# Patient Record
Sex: Female | Born: 1990 | Race: White | Hispanic: No | Marital: Married | Smoking: Never smoker
Health system: Southern US, Community
[De-identification: ages and names within clinical notes are randomized; demographics above are authoritative.]

## PROBLEM LIST (undated history)

## (undated) DIAGNOSIS — O368130 Decreased fetal movements, third trimester, not applicable or unspecified: Secondary | ICD-10-CM

## (undated) DIAGNOSIS — M779 Enthesopathy, unspecified: Secondary | ICD-10-CM

## (undated) DIAGNOSIS — E663 Overweight: Secondary | ICD-10-CM

## (undated) DIAGNOSIS — I499 Cardiac arrhythmia, unspecified: Secondary | ICD-10-CM

## (undated) DIAGNOSIS — J45909 Unspecified asthma, uncomplicated: Secondary | ICD-10-CM

## (undated) DIAGNOSIS — N946 Dysmenorrhea, unspecified: Secondary | ICD-10-CM

## (undated) DIAGNOSIS — N920 Excessive and frequent menstruation with regular cycle: Secondary | ICD-10-CM

## (undated) DIAGNOSIS — N390 Urinary tract infection, site not specified: Secondary | ICD-10-CM

## (undated) DIAGNOSIS — M778 Other enthesopathies, not elsewhere classified: Secondary | ICD-10-CM

## (undated) DIAGNOSIS — E162 Hypoglycemia, unspecified: Secondary | ICD-10-CM

## (undated) HISTORY — DX: Overweight: E66.3

## (undated) HISTORY — DX: Other enthesopathies, not elsewhere classified: M77.8

## (undated) HISTORY — DX: Urinary tract infection, site not specified: N39.0

## (undated) HISTORY — DX: Cardiac arrhythmia, unspecified: I49.9

## (undated) HISTORY — DX: Dysmenorrhea, unspecified: N94.6

## (undated) HISTORY — DX: Hypoglycemia, unspecified: E16.2

## (undated) HISTORY — PX: WISDOM TOOTH EXTRACTION: SHX21

## (undated) HISTORY — DX: Enthesopathy, unspecified: M77.9

## (undated) HISTORY — DX: Excessive and frequent menstruation with regular cycle: N92.0

---

## 2015-09-02 ENCOUNTER — Ambulatory Visit (INDEPENDENT_AMBULATORY_CARE_PROVIDER_SITE_OTHER): Payer: BLUE CROSS/BLUE SHIELD | Admitting: Obstetrics and Gynecology

## 2015-09-02 VITALS — BP 103/73 | HR 71 | Ht 61.0 in | Wt 131.4 lb

## 2015-09-02 DIAGNOSIS — J029 Acute pharyngitis, unspecified: Secondary | ICD-10-CM

## 2015-09-02 LAB — POCT RAPID STREP A (OFFICE): Rapid Strep A Screen: NEGATIVE

## 2015-09-02 NOTE — Progress Notes (Signed)
Subjective:     Patient ID: Reem Fleury, female   DOB: 10-08-1991, 24 y.o.   MRN: [id number]  HPI Sore throat x 3 days Chest heaviness- not r/t irregular HR  Review of Systems Sore throat x 3 days with generalized malaise, low grade fever. Denies cough or sinus drainage  States her left chest feels heavy and sore at times, denies any recent arrythmia or r/t exertion, last for a few days then resolves- feels like it is muscles and on the surface- does not radiate. Concerned due to father recently died of massive heart attack at age 26.    Objective:   Physical Exam A&O x4 Well groomed female, looks tired Sinuses clear, back of throat slightly red, negative rapid strep test. Lungs clear bilaterally, negative lymphadenopathy. HRR.    Assessment:     Viral sore throat Chest heaviness suspected muscle etiology     Plan:     Tylenol prn, to let me know if symptoms don't resolve in 1 week.  RTC prn  Melody Trudee Kuster, CNM

## 2016-01-13 ENCOUNTER — Other Ambulatory Visit: Payer: Self-pay | Admitting: Obstetrics and Gynecology

## 2016-01-13 MED ORDER — CEFDINIR 300 MG PO CAPS: 300.0000 mg | ORAL_CAPSULE | Freq: Two times a day (BID) | ORAL | Status: DC

## 2016-03-02 ENCOUNTER — Other Ambulatory Visit: Payer: Self-pay | Admitting: Obstetrics and Gynecology

## 2016-03-02 MED ORDER — CEFDINIR 300 MG PO CAPS: 300.0000 mg | ORAL_CAPSULE | Freq: Two times a day (BID) | ORAL | Status: DC

## 2016-04-14 ENCOUNTER — Encounter: Payer: Self-pay | Admitting: *Deleted

## 2016-04-20 ENCOUNTER — Ambulatory Visit (INDEPENDENT_AMBULATORY_CARE_PROVIDER_SITE_OTHER): Payer: BLUE CROSS/BLUE SHIELD | Admitting: Obstetrics and Gynecology

## 2016-04-20 ENCOUNTER — Other Ambulatory Visit: Payer: Self-pay | Admitting: Obstetrics and Gynecology

## 2016-04-20 ENCOUNTER — Encounter: Payer: Self-pay | Admitting: Obstetrics and Gynecology

## 2016-04-20 VITALS — BP 98/64 | HR 88 | Ht 61.0 in | Wt 142.9 lb

## 2016-04-20 DIAGNOSIS — Z01419 Encounter for gynecological examination (general) (routine) without abnormal findings: Secondary | ICD-10-CM

## 2016-04-20 MED ORDER — NORETHIN ACE-ETH ESTRAD-FE 1-20 MG-MCG PO TABS
1.0000 | ORAL_TABLET | Freq: Every day | ORAL | Status: DC
Start: 2016-04-20 — End: 2016-04-20

## 2016-04-20 MED ORDER — NORETHIN ACE-ETH ESTRAD-FE 1-20 MG-MCG(24) PO CAPS
1.0000 | ORAL_CAPSULE | Freq: Every day | ORAL | Status: DC
Start: 2016-04-20 — End: 2016-09-14

## 2016-04-20 NOTE — Progress Notes (Signed)
  Subjective:     Selena White is a 25 y.o. female and is here for a comprehensive physical exam. The patient reports desires restarting weight loss meds.  Social History   Social History  . Marital Status: Married    Spouse Name: N/A  . Number of Children: N/A  . Years of Education: N/A   Occupational History  .  [employer]   Social History Main Topics  . Smoking status: Never Smoker   . Smokeless tobacco: Never Used  . Alcohol Use: No  . Drug Use: No  . Sexual Activity:    Partners: Male   Other Topics Concern  . Not on file   Social History Narrative   Health Maintenance  Topic Date Due  . HIV Screening  06/25/2006  . TETANUS/TDAP  06/25/2010  . PAP SMEAR  06/25/2012  . INFLUENZA VACCINE  07/12/2016    The following portions of the patient's history were reviewed and updated as appropriate: allergies, current medications, past family history, past medical history, past social history, past surgical history and problem list.  Review of Systems A comprehensive review of systems was negative.   Objective:    General appearance: alert, cooperative and appears stated age Neck: no adenopathy, no carotid bruit, no JVD, supple, symmetrical, trachea midline and thyroid not enlarged, symmetric, no tenderness/mass/nodules Lungs: clear to auscultation bilaterally Breasts: normal appearance, no masses or tenderness Heart: regular rate and rhythm, S1, S2 normal, no murmur, click, rub or gallop Abdomen: soft, non-tender; bowel sounds normal; no masses,  no organomegaly Pelvic: cervix normal in appearance, external genitalia normal, no adnexal masses or tenderness, no cervical motion tenderness, rectovaginal septum normal, uterus normal size, shape, and consistency and vagina normal without discharge Extremities: extremities normal, atraumatic, no cyanosis or edema    Assessment:    Healthy female exam. Overweight OCP user      Plan:  meds  refilled, and will restart weight loss program in future. RTC 1 year   See After Visit Summary for Counseling Recommendations

## 2016-04-20 NOTE — Patient Instructions (Signed)
Place annual gynecologic exam patient instructions here.

## 2016-04-21 LAB — CYTOLOGY - PAP

## 2016-05-18 ENCOUNTER — Ambulatory Visit: Payer: BLUE CROSS/BLUE SHIELD

## 2016-05-20 ENCOUNTER — Ambulatory Visit (INDEPENDENT_AMBULATORY_CARE_PROVIDER_SITE_OTHER): Payer: BLUE CROSS/BLUE SHIELD | Admitting: Obstetrics and Gynecology

## 2016-05-20 VITALS — BP 107/71 | HR 86 | Ht 61.0 in | Wt 140.2 lb

## 2016-05-20 DIAGNOSIS — R5383 Other fatigue: Secondary | ICD-10-CM

## 2016-05-20 MED ORDER — CYANOCOBALAMIN 1000 MCG/ML IJ SOLN
1000.0000 ug | Freq: Once | INTRAMUSCULAR | Status: AC
  Administered 2016-05-20: 1000 ug via INTRAMUSCULAR

## 2016-05-20 NOTE — Patient Instructions (Signed)
Pt to follow up in 4wks.  

## 2016-05-20 NOTE — Progress Notes (Signed)
Filed Weights   05/20/16 0926  Weight: 140 lb 3.2 oz (63.594 kg)   Pt presents for weight, B/P, B-12 injection. Pt notes that she was initially restless with medication at night but has since improved. Pt also notes that she has been on vacation and has not been able to maintain healthy eating habits yet due to this.  Weight loss of 2lbs. Pt given 1mL injection of B12, which she tolerated well. To return in four weeks. Encouraged eating healthy and exercise.

## 2016-06-17 ENCOUNTER — Ambulatory Visit: Payer: BLUE CROSS/BLUE SHIELD

## 2016-06-22 ENCOUNTER — Ambulatory Visit (INDEPENDENT_AMBULATORY_CARE_PROVIDER_SITE_OTHER): Payer: BLUE CROSS/BLUE SHIELD | Admitting: Obstetrics and Gynecology

## 2016-06-22 VITALS — BP 107/75 | HR 87 | Wt 140.2 lb

## 2016-06-22 DIAGNOSIS — R5383 Other fatigue: Secondary | ICD-10-CM

## 2016-06-22 DIAGNOSIS — E669 Obesity, unspecified: Secondary | ICD-10-CM | POA: Diagnosis not present

## 2016-06-22 MED ORDER — CYANOCOBALAMIN 1000 MCG/ML IJ SOLN
1000.0000 ug | Freq: Once | INTRAMUSCULAR | Status: AC
  Administered 2016-06-22: 1000 ug via INTRAMUSCULAR

## 2016-06-22 NOTE — Progress Notes (Signed)
Patient ID: Selena SimsKelsey White, female   DOB: Jul 24, 1991, 25 y.o.   MRN: [id number] Pt presents for weight, B/P, B-12 injection. No side effects of medication-Phentermine, or B-12.  Weight remains same at 140 lbs. Encouraged eating healthy and exercise. Pt has been on vacation and not eaten as she should have.

## 2016-07-20 ENCOUNTER — Ambulatory Visit: Payer: BLUE CROSS/BLUE SHIELD

## 2016-07-21 ENCOUNTER — Ambulatory Visit: Payer: BLUE CROSS/BLUE SHIELD

## 2016-07-28 ENCOUNTER — Ambulatory Visit (INDEPENDENT_AMBULATORY_CARE_PROVIDER_SITE_OTHER): Payer: BLUE CROSS/BLUE SHIELD | Admitting: Obstetrics and Gynecology

## 2016-07-28 VITALS — BP 115/82 | HR 86 | Ht 61.0 in | Wt 139.2 lb

## 2016-07-28 DIAGNOSIS — E669 Obesity, unspecified: Secondary | ICD-10-CM

## 2016-07-28 MED ORDER — CYANOCOBALAMIN 1000 MCG/ML IJ SOLN: 1000.0000 ug | Freq: Once | INTRAMUSCULAR | 2 refills | Status: AC

## 2016-07-28 MED ORDER — CYANOCOBALAMIN 1000 MCG/ML IJ SOLN
1000.0000 ug | Freq: Once | INTRAMUSCULAR | Status: AC
  Administered 2016-07-28: 1000 ug via INTRAMUSCULAR

## 2016-07-28 NOTE — Progress Notes (Signed)
Patient ID: Selena SimsKelsey White, female   DOB: Aug 01, 1991, 25 y.o.   MRN: [id number] Pt presents for weight, B/P, B-12 injection. No side effects of medication-Phentermine, or B-12.  Weight loss of 1 lbs. Encouraged eating healthy and exercise.

## 2016-07-28 NOTE — Addendum Note (Signed)
Addended by: Jackquline DenmarkIDGEWAY, Abbigayle Toole W on: 07/28/2016 04:04 PM   Modules accepted: Orders

## 2016-08-25 ENCOUNTER — Ambulatory Visit: Payer: BLUE CROSS/BLUE SHIELD | Admitting: Obstetrics and Gynecology

## 2016-09-14 ENCOUNTER — Encounter: Payer: Self-pay | Admitting: Obstetrics and Gynecology

## 2016-09-14 ENCOUNTER — Ambulatory Visit (INDEPENDENT_AMBULATORY_CARE_PROVIDER_SITE_OTHER): Payer: BLUE CROSS/BLUE SHIELD | Admitting: Obstetrics and Gynecology

## 2016-09-14 VITALS — BP 105/73 | HR 101 | Ht 61.0 in | Wt 139.1 lb

## 2016-09-14 DIAGNOSIS — N920 Excessive and frequent menstruation with regular cycle: Secondary | ICD-10-CM | POA: Diagnosis not present

## 2016-09-14 DIAGNOSIS — E663 Overweight: Secondary | ICD-10-CM | POA: Diagnosis not present

## 2016-09-14 DIAGNOSIS — Z79899 Other long term (current) drug therapy: Secondary | ICD-10-CM | POA: Diagnosis not present

## 2016-09-14 MED ORDER — CYANOCOBALAMIN 1000 MCG/ML IJ SOLN: INTRAMUSCULAR | 1 refills | Status: DC

## 2016-09-14 MED ORDER — PHENTERMINE HCL 37.5 MG PO TABS: 37.5000 mg | ORAL_TABLET | Freq: Every day | ORAL | 2 refills | Status: DC

## 2016-09-14 MED ORDER — TAYTULLA 1-20 MG-MCG(24) PO CAPS: 1.0000 | ORAL_CAPSULE | Freq: Every day | ORAL | 11 refills | Status: DC

## 2016-09-14 NOTE — Progress Notes (Signed)
SUBJECTIVE:  25 y.o. here for follow-up weight loss visit, previously seen 4 weeks ago. Denies any concerns and feels like medication is still working but she has not been taking it daily and hasn't been exercising. Has been out of medicine for 3 weeks and desires restarting, feels like she is in a good place to get back on track.  OBJECTIVE:  BP 105/73   Pulse (!) 101   Ht 5\' 1"  (1.549 m)   Wt 139 lb 1.6 oz (63.1 kg)   LMP 08/17/2016   BMI 26.28 kg/m   Body mass index is 26.28 kg/m. Patient appears well. ASSESSMENT:  Overweight- responding well to weight loss plan PLAN:  To continue with current medications. B12 108100mcg/ml injection given RTC in 4 weeks as planned  Ellysia Char [place], CNM

## 2016-10-12 ENCOUNTER — Ambulatory Visit: Payer: BLUE CROSS/BLUE SHIELD

## 2016-10-28 ENCOUNTER — Telehealth: Payer: Self-pay | Admitting: Obstetrics and Gynecology

## 2016-10-28 ENCOUNTER — Other Ambulatory Visit: Payer: Self-pay | Admitting: Obstetrics and Gynecology

## 2016-10-28 MED ORDER — CEFDINIR 300 MG PO CAPS: 300.0000 mg | ORAL_CAPSULE | Freq: Two times a day (BID) | ORAL | 1 refills | Status: DC

## 2016-10-28 NOTE — Telephone Encounter (Signed)
Pt called and wanted me to seen message so that you can call in antibiotic for her, she uses [facility].

## 2016-12-12 NOTE — L&D Delivery Note (Signed)
Delivery Note At  2017 a viable and healthy Female "Selena White"  was delivered via  (Presentation:OA ;  ).  APGAR: 7,8 ; weight 7#3oz  .   Placenta status: delivered intact with 3 vessel  Cord:  with the following complications: NCx1 with infant delivered through it  Anesthesia: none  Episiotomy:  none Lacerations:  3rd Suture Repair: 3.0 vicryl rapide Est. Blood Loss (mL):  300  Mom to postpartum.  Baby to Couplet care / Skin to Skin.  Melody N Shambley,CNM 10/28/2017, 8:55 PM

## 2017-03-21 ENCOUNTER — Encounter: Payer: Self-pay | Admitting: Certified Nurse Midwife

## 2017-03-21 ENCOUNTER — Ambulatory Visit (INDEPENDENT_AMBULATORY_CARE_PROVIDER_SITE_OTHER): Payer: BLUE CROSS/BLUE SHIELD | Admitting: Certified Nurse Midwife

## 2017-03-21 VITALS — BP 95/68 | HR 98 | Ht 61.0 in | Wt 150.9 lb

## 2017-03-21 DIAGNOSIS — N912 Amenorrhea, unspecified: Secondary | ICD-10-CM | POA: Diagnosis not present

## 2017-03-21 LAB — POCT URINE PREGNANCY: Preg Test, Ur: POSITIVE — AB

## 2017-03-21 MED ORDER — DOXYLAMINE-PYRIDOXINE 10-10 MG PO TBEC: DELAYED_RELEASE_TABLET | ORAL | 1 refills | Status: DC

## 2017-03-21 NOTE — Progress Notes (Signed)
Subjective:    Selena White is a 26 y.o. female who presents for evaluation of amenorrhea. She believes she could be pregnant. Pregnancy is desired. Sexual Activity: single partner, contraception: none. Current symptoms also include: morning sickness and nausea. Last period was normal.   Patient's last menstrual period was 02/05/2017 (exact date). The following portions of the patient's history were reviewed and updated as appropriate: allergies, current medications, past family history, past medical history, past social history, past surgical history and problem list.  Review of Systems Pertinent items are noted in HPI.     Objective:    BP 95/68   Pulse 98   Ht  (1.549 m)   Wt 150 lb 14.4 oz (68.4 kg)   LMP 02/05/2017 (Exact Date)   BMI 28.51 kg/m  General: alert, cooperative, appears stated age and no acute distress    Lab Review Urine HCG: positive    Assessment:    Absence of menstruation.     Plan:    Pregnancy Test: EDC: 11/12/2017  Briefly discussed pre-natal care options.  Encouraged well-balanced diet, plenty of rest when needed, pre-natal vitamins daily and walking for exercise. Discussed self-help for nausea, avoiding OTC medications until consulting provider or pharmacist, other than Tylenol as needed, minimal caffeine (1-2 cups daily) and avoiding alcohol. Sample of prenatal vitamins and diclegis given. Signs and symptoms of miscarriage reviewed. Next appointment for dating u/s at 8 wks, OB nurse visit at 10wks, NOB physical exam at 12wks. Feel free to call with any questions.

## 2017-03-21 NOTE — Patient Instructions (Signed)

## 2017-03-30 ENCOUNTER — Telehealth: Payer: Self-pay | Admitting: Certified Nurse Midwife

## 2017-03-30 NOTE — Telephone Encounter (Signed)
Called pt no answer. LM for pt informing her to take Vitamin B6  TID and 1 Unisom  daily to help with n/v until prior auth can be completed. Also advised on bland meals and ginger ale. Also sent pt education via mychart.

## 2017-03-30 NOTE — Telephone Encounter (Signed)
Patient Selena White that she needs insurance authorization for her nausea medication asap please call her

## 2017-04-03 ENCOUNTER — Ambulatory Visit (INDEPENDENT_AMBULATORY_CARE_PROVIDER_SITE_OTHER): Payer: BLUE CROSS/BLUE SHIELD | Admitting: Obstetrics and Gynecology

## 2017-04-03 ENCOUNTER — Ambulatory Visit (INDEPENDENT_AMBULATORY_CARE_PROVIDER_SITE_OTHER): Payer: BLUE CROSS/BLUE SHIELD

## 2017-04-03 VITALS — BP 109/70 | HR 78 | Ht 61.0 in | Wt 149.0 lb

## 2017-04-03 DIAGNOSIS — Z3401 Encounter for supervision of normal first pregnancy, first trimester: Secondary | ICD-10-CM

## 2017-04-03 DIAGNOSIS — N912 Amenorrhea, unspecified: Secondary | ICD-10-CM | POA: Diagnosis not present

## 2017-04-03 DIAGNOSIS — Z113 Encounter for screening for infections with a predominantly sexual mode of transmission: Secondary | ICD-10-CM

## 2017-04-03 DIAGNOSIS — Z1389 Encounter for screening for other disorder: Secondary | ICD-10-CM

## 2017-04-03 DIAGNOSIS — Z833 Family history of diabetes mellitus: Secondary | ICD-10-CM

## 2017-04-03 MED ORDER — PRIMACARE 30-1-470 MG PO CAPS: 1.0000 | ORAL_CAPSULE | Freq: Every day | ORAL | 11 refills | Status: DC

## 2017-04-03 NOTE — Patient Instructions (Signed)
Pregnancy and Zika Virus Disease Zika virus disease, or Zika, is an illness that can spread to people from mosquitoes that carry the virus. It may also spread from person to person through infected body fluids. Zika first occurred in [place], but recently it has spread to new areas. The virus occurs in tropical climates. The location of Zika continues to change. Most people who become infected with Zika virus do not develop serious illness. However, Zika may cause birth defects in an unborn baby whose mother is infected with the virus. It may also increase the risk of miscarriage. What are the symptoms of Zika virus disease? In many cases, people who have been infected with Zika virus do not develop any symptoms. If symptoms appear, they usually start about a week after the person is infected. Symptoms are usually mild. They may include:  Fever.  Rash.  Red eyes.  Joint pain. How does Zika virus disease spread? The main way that Zika virus spreads is through the bite of a certain type of mosquito. Unlike most types of mosquitos, which bite only at night, the type of mosquito that carries Zika virus bites both at night and during the day. Zika virus can also spread through sexual contact, through a blood transfusion, and from a mother to her baby before or during birth. Once you have had Zika virus disease, it is unlikely that you will get it again. Can I pass Zika to my baby during pregnancy? Yes, Zika can pass from a mother to her baby before or during birth. What problems can Zika cause for my baby? A woman who is infected with Zika virus while pregnant is at risk of having her baby born with a condition in which the brain or head is smaller than expected (microcephaly). Babies who have microcephaly can have developmental delays, seizures, hearing problems, and vision problems. Having Zika virus disease during pregnancy can also increase the risk of miscarriage. How can Zika virus disease be  prevented? There is no vaccine to prevent Zika. The best way to prevent the disease is to avoid infected mosquitoes and avoid exposure to body fluids that can spread the virus. Avoid any possible exposure to Zika by taking the following precautions. For women and their sex partners:  Avoid traveling to high-risk areas. The locations where Zika is being reported change often. To identify high-risk areas, check the CDC travel website: www.cdc.gov/zika/geo/index.html  If you or your sex partner must travel to a high-risk area, talk with a health care provider before and after traveling.  Take all precautions to avoid mosquito bites if you live in, or travel to, any of the high-risk areas. Insect repellents are safe to use during pregnancy.  Ask your health care provider when it is safe to have sexual contact. For women:  If you are pregnant or trying to become pregnant, avoid sexual contact with persons who may have been exposed to Zika virus, persons who have possible symptoms of Zika, or persons whose history you are unsure about. If you choose to have sexual contact with someone who may have been exposed to Zika virus, use condoms correctly during the entire duration of sexual activity, every time. Do not share sexual devices, as you may be exposed to body fluids.  Ask your health care provider about when it is safe to attempt pregnancy after a possible exposure to Zika virus. What steps should I take to avoid mosquito bites? Take these steps to avoid mosquito bites when you are   in a high-risk area:  Wear loose clothing that covers your arms and legs.  Limit your outdoor activities.  Do not open windows unless they have window screens.  Sleep under mosquito nets.  Use insect repellent. The best insect repellents have:  DEET, picaridin, oil of lemon eucalyptus (OLE), or IR3535 in them.  Higher amounts of an active ingredient in them.  Remember that insect repellents are safe to use  during pregnancy.  Do not use OLE on children who are younger than 3 years of age. Do not use insect repellent on babies who are younger than 2 months of age.  Cover your child's stroller with mosquito netting. Make sure the netting fits snugly and that any loose netting does not cover your child's mouth or nose. Do not use a blanket as a mosquito-protection cover.  Do not apply insect repellent underneath clothing.  If you are using sunscreen, apply the sunscreen before applying the insect repellent.  Treat clothing with permethrin. Do not apply permethrin directly to your skin. Follow label directions for safe use.  Get rid of standing water, where mosquitoes may reproduce. Standing water is often found in items such as buckets, bowls, animal food dishes, and flowerpots. When you return from traveling to any high-risk area, continue taking actions to protect yourself against mosquito bites for 3 weeks, even if you show no signs of illness. This will prevent spreading Zika virus to uninfected mosquitoes. What should I know about the sexual transmission of Zika? People can spread Zika to their sexual partners during vaginal, anal, or oral sex, or by sharing sexual devices. Many people with Zika do not develop symptoms, so a person could spread the disease without knowing that they are infected. The greatest risk is to women who are pregnant or who may become pregnant. Zika virus can live longer in semen than it can live in blood. Couples can prevent sexual transmission of the virus by:  Using condoms correctly during the entire duration of sexual activity, every time. This includes vaginal, anal, and oral sex.  Not sharing sexual devices. Sharing increases your risk of being exposed to body fluid from another person.  Avoiding all sexual activity until your health care provider says it is safe. Should I be tested for Zika virus? A sample of your blood can be tested for Zika virus. A pregnant  woman should be tested if she may have been exposed to the virus or if she has symptoms of Zika. She may also have additional tests done during her pregnancy, such ultrasound testing. Talk with your health care provider about which tests are recommended. This information is not intended to replace advice given to you by your health care provider. Make sure you discuss any questions you have with your health care provider. Document Released: 08/19/2015 Document Revised: 05/05/2016 Document Reviewed: 08/12/2015 Elsevier Interactive Patient Education  2017 [employer] Hyperemesis Gravidarum Hyperemesis gravidarum is a severe form of nausea and vomiting that happens during pregnancy. Hyperemesis is worse than morning sickness. It may cause you to have nausea or vomiting all day for many days. It may keep you from eating and drinking enough food and liquids. Hyperemesis usually occurs during the first half (the first 20 weeks) of pregnancy. It often goes away once a woman is in her second half of pregnancy. However, sometimes hyperemesis continues through an entire pregnancy. What are the causes? The cause of this condition is not known. It may be related to changes in chemicals (hormones)   in the body during pregnancy, such as the high level of pregnancy hormone (human chorionic gonadotropin) or the increase in the female sex hormone (estrogen). What are the signs or symptoms? Symptoms of this condition include:  Severe nausea and vomiting.  Nausea that does not go away.  Vomiting that does not allow you to keep any food down.  Weight loss.  Body fluid loss (dehydration).  Having no desire to eat, or not liking food that you have previously enjoyed. How is this diagnosed? This condition may be diagnosed based on:  A physical exam.  Your medical history.  Your symptoms.  Blood tests.  Urine tests. How is this treated? This condition may be managed with medicine. If medicines to do not  help relieve nausea and vomiting, you may need to receive fluids through an IV tube at the hospital. Follow these instructions at home:  Take over-the-counter and prescription medicines only as told by your health care provider.  Avoid iron pills and multivitamins that contain iron for the first 3-4 months of pregnancy. If you take prescription iron pills, do not stop taking them unless your health care provider approves.  Take the following actions to help prevent nausea and vomiting:  In the morning, before getting out of bed, try eating a couple of dry crackers or a piece of toast.  Avoid foods and smells that upset your stomach. Fatty and spicy foods may make nausea worse.  Eat 5-6 small meals a day.  Do not drink fluids while eating meals. Drink between meals.  Eat or suck on things that have ginger in them. Ginger can help relieve nausea.  Avoid food preparation. The smell of food can spoil your appetite or trigger nausea.  Follow instructions from your health care provider about eating or drinking restrictions.  For snacks, eat high-protein foods, such as cheese.  Keep all follow-up and pre-birth (prenatal) visits as told by your health care provider. This is important. Contact a health care provider if:  You have pain in your abdomen.  You have a severe headache.  You have vision problems.  You are losing weight. Get help right away if:  You cannot drink fluids without vomiting.  You vomit blood.  You have constant nausea and vomiting.  You are very weak.  You are very thirsty.  You feel dizzy.  You faint.  You have a fever or other symptoms that last for more than 2-3 days.  You have a fever and your symptoms suddenly get worse. Summary  Hyperemesis gravidarum is a severe form of nausea and vomiting that happens during pregnancy.  Making some changes to your eating habits may help relieve nausea and vomiting.  This condition may be managed with  medicine.  If medicines to do not help relieve nausea and vomiting, you may need to receive fluids through an IV tube at the hospital. This information is not intended to replace advice given to you by your health care provider. Make sure you discuss any questions you have with your health care provider. Document Released: 11/28/2005 Document Revised: 07/27/2016 Document Reviewed: 07/27/2016 Elsevier Interactive Patient Education  2017 [employer] First Trimester of Pregnancy The first trimester of pregnancy is from week 1 until the end of week 13 (months 1 through 3). During this time, your baby will begin to develop inside you. At 6-8 weeks, the eyes and face are formed, and the heartbeat can be seen on ultrasound. At the end of 12 weeks, all the baby's   organs are formed. Prenatal care is all the medical care you receive before the birth of your baby. Make sure you get good prenatal care and follow all of your doctor's instructions. Follow these instructions at home: Medicines   Take over-the-counter and prescription medicines only as told by your doctor. Some medicines are safe and some medicines are not safe during pregnancy.  Take a prenatal vitamin that contains at least 600 micrograms (mcg) of folic acid.  If you have trouble pooping (constipation), take medicine that will make your stool soft (stool softener) if your doctor approves. Eating and drinking   Eat regular, healthy meals.  Your doctor will tell you the amount of weight gain that is right for you.  Avoid raw meat and uncooked cheese.  If you feel sick to your stomach (nauseous) or throw up (vomit):  Eat 4 or 5 small meals a day instead of 3 large meals.  Try eating a few soda crackers.  Drink liquids between meals instead of during meals.  To prevent constipation:  Eat foods that are high in fiber, like fresh fruits and vegetables, whole grains, and beans.  Drink enough fluids to keep your pee (urine) clear or  pale yellow. Activity   Exercise only as told by your doctor. Stop exercising if you have cramps or pain in your lower belly (abdomen) or low back.  Do not exercise if it is too hot, too humid, or if you are in a place of great height (high altitude).  Try to avoid standing for long periods of time. Move your legs often if you must stand in one place for a long time.  Avoid heavy lifting.  Wear low-heeled shoes. Sit and stand up straight.  You can have sex unless your doctor tells you not to. Relieving pain and discomfort   Wear a good support bra if your breasts are sore.  Take warm water baths (sitz baths) to soothe pain or discomfort caused by hemorrhoids. Use hemorrhoid cream if your doctor says it is okay.  Rest with your legs raised if you have leg cramps or low back pain.  If you have puffy, bulging veins (varicose veins) in your legs:  Wear support hose or compression stockings as told by your doctor.  Raise (elevate) your feet for 15 minutes, 3-4 times a day.  Limit salt in your food. Prenatal care   Schedule your prenatal visits by the twelfth week of pregnancy.  Write down your questions. Take them to your prenatal visits.  Keep all your prenatal visits as told by your doctor. This is important. Safety   Wear your seat belt at all times when driving.  Make a list of emergency phone numbers. The list should include numbers for family, friends, the hospital, and police and fire departments. General instructions   Ask your doctor for a referral to a local prenatal class. Begin classes no later than at the start of month 6 of your pregnancy.  Ask for help if you need counseling or if you need help with nutrition. Your doctor can give you advice or tell you where to go for help.  Do not use hot tubs, steam rooms, or saunas.  Do not douche or use tampons or scented sanitary pads.  Do not cross your legs for long periods of time.  Avoid all herbs and alcohol.  Avoid drugs that are not approved by your doctor.  Do not use any tobacco products, including cigarettes, chewing tobacco, and electronic cigarettes.   If you need help quitting, ask your doctor. You may get counseling or other support to help you quit.  Avoid cat litter boxes and soil used by cats. These carry germs that can cause birth defects in the baby and can cause a loss of your baby (miscarriage) or stillbirth.  Visit your dentist. At home, brush your teeth with a soft toothbrush. Be gentle when you floss. Contact a doctor if:  You are dizzy.  You have mild cramps or pressure in your lower belly.  You have a nagging pain in your belly area.  You continue to feel sick to your stomach, you throw up, or you have watery poop (diarrhea).  You have a bad smelling fluid coming from your vagina.  You have pain when you pee (urinate).  You have increased puffiness (swelling) in your face, hands, legs, or ankles. Get help right away if:  You have a fever.  You are leaking fluid from your vagina.  You have spotting or bleeding from your vagina.  You have very bad belly cramping or pain.  You gain or lose weight rapidly.  You throw up blood. It may look like coffee grounds.  You are around people who have German measles, fifth disease, or chickenpox.  You have a very bad headache.  You have shortness of breath.  You have any kind of trauma, such as from a fall or a car accident. Summary  The first trimester of pregnancy is from week 1 until the end of week 13 (months 1 through 3).  To take care of yourself and your unborn baby, you will need to eat healthy meals, take medicines only if your doctor tells you to do so, and do activities that are safe for you and your baby.  Keep all follow-up visits as told by your doctor. This is important as your doctor will have to ensure that your baby is healthy and growing well. This information is not intended to replace advice given  to you by your health care provider. Make sure you discuss any questions you have with your health care provider. Document Released: 05/16/2008 Document Revised: 12/06/2016 Document Reviewed: 12/06/2016 Elsevier Interactive Patient Education  2017 [employer] Commonly Asked Questions During Pregnancy  Cats: A parasite can be excreted in cat feces.  To avoid exposure you need to have another person empty the little box.  If you must empty the litter box you will need to wear gloves.  Wash your hands after handling your cat.  This parasite can also be found in raw or undercooked meat so this should also be avoided.  Colds, Sore Throats, Flu: Please check your medication sheet to see what you can take for symptoms.  If your symptoms are unrelieved by these medications please call the office.  Dental Work: Most any dental work your dentist recommends is permitted.  X-rays should only be taken during the first trimester if absolutely necessary.  Your abdomen should be shielded with a lead apron during all x-rays.  Please notify your provider prior to receiving any x-rays.  Novocaine is fine; gas is not recommended.  If your dentist requires a note from us prior to dental work please call the office and we will provide one for you.  Exercise: Exercise is an important part of staying healthy during your pregnancy.  You may continue most exercises you were accustomed to prior to pregnancy.  Later in your pregnancy you will most likely notice you have difficulty with activities   requiring balance like riding a bicycle.  It is important that you listen to your body and avoid activities that put you at a higher risk of falling.  Adequate rest and staying well hydrated are a must!  If you have questions about the safety of specific activities ask your provider.    Exposure to Children with illness: Try to avoid obvious exposure; report any symptoms to us when noted,  If you have chicken pos, red measles or mumps,  you should be immune to these diseases.   Please do not take any vaccines while pregnant unless you have checked with your OB provider.  Fetal Movement: After 28 weeks we recommend you do "kick counts" twice daily.  Lie or sit down in a calm quiet environment and count your baby movements "kicks".  You should feel your baby at least 10 times per hour.  If you have not felt 10 kicks within the first hour get up, walk around and have something sweet to eat or drink then repeat for an additional hour.  If count remains less than 10 per hour notify your provider.  Fumigating: Follow your pest control agent's advice as to how long to stay out of your home.  Ventilate the area well before re-entering.  Hemorrhoids:   Most over-the-counter preparations can be used during pregnancy.  Check your medication to see what is safe to use.  It is important to use a stool softener or fiber in your diet and to drink lots of liquids.  If hemorrhoids seem to be getting worse please call the office.   Hot Tubs:  Hot tubs Jacuzzis and saunas are not recommended while pregnant.  These increase your internal body temperature and should be avoided.  Intercourse:  Sexual intercourse is safe during pregnancy as long as you are comfortable, unless otherwise advised by your provider.  Spotting may occur after intercourse; report any bright red bleeding that is heavier than spotting.  Labor:  If you know that you are in labor, please go to the hospital.  If you are unsure, please call the office and let us help you decide what to do.  Lifting, straining, etc:  If your job requires heavy lifting or straining please check with your provider for any limitations.  Generally, you should not lift items heavier than that you can lift simply with your hands and arms (no back muscles)  Painting:  Paint fumes do not harm your pregnancy, but may make you ill and should be avoided if possible.  Latex or water based paints have less odor  than oils.  Use adequate ventilation while painting.  Permanents & Hair Color:  Chemicals in hair dyes are not recommended as they cause increase hair dryness which can increase hair loss during pregnancy.  " Highlighting" and permanents are allowed.  Dye may be absorbed differently and permanents may not hold as well during pregnancy.  Sunbathing:  Use a sunscreen, as skin burns easily during pregnancy.  Drink plenty of fluids; avoid over heating.  Tanning Beds:  Because their possible side effects are still unknown, tanning beds are not recommended.  Ultrasound Scans:  Routine ultrasounds are performed at approximately 20 weeks.  You will be able to see your baby's general anatomy an if you would like to know the gender this can usually be determined as well.  If it is questionable when you conceived you may also receive an ultrasound early in your pregnancy for dating purposes.  Otherwise ultrasound exams   are not routinely performed unless there is a medical necessity.  Although you can request a scan we ask that you pay for it when conducted because insurance does not cover " patient request" scans.  Work: If your pregnancy proceeds without complications you may work until your due date, unless your physician or employer advises otherwise.  Round Ligament Pain/Pelvic Discomfort:  Sharp, shooting pains not associated with bleeding are fairly common, usually occurring in the second trimester of pregnancy.  They tend to be worse when standing up or when you remain standing for long periods of time.  These are the result of pressure of certain pelvic ligaments called "round ligaments".  Rest, Tylenol and heat seem to be the most effective relief.  As the womb and fetus grow, they rise out of the pelvis and the discomfort improves.  Please notify the office if your pain seems different than that described.  It may represent a more serious condition.   

## 2017-04-03 NOTE — Progress Notes (Signed)
Selena White presents for NOB nurse interview visit. Pregnancy confirmation done by A. Janee Morn, CNM on 03/21/2017, UPT: positive. Ultrasound done today with dates +1 day difference.  G-1.  P-0000. Pregnancy education material explained and given. Husband empties cat box cats.  NOB labs ordered. HgbA1c ordered due to family history of diabetes. Pt desired lab. HIV labs and Drug screen were explained optional and she did not decline. Drug screen ordered. PNV encouraged. Genetic screening options discussed. Genetic testing: Unsure.  Pt may discuss with provider. Pt. To follow up with provider in 3 weeks for NOB physical.  All questions answered.

## 2017-04-04 LAB — URINALYSIS, ROUTINE W REFLEX MICROSCOPIC
Bilirubin, UA: NEGATIVE
GLUCOSE, UA: NEGATIVE
Ketones, UA: NEGATIVE
Nitrite, UA: NEGATIVE
PROTEIN UA: NEGATIVE
RBC, UA: NEGATIVE
Specific Gravity, UA: 1.009 (ref 1.005–1.030)
UUROB: 0.2 mg/dL (ref 0.2–1.0)
pH, UA: 7.5 (ref 5.0–7.5)

## 2017-04-04 LAB — MICROSCOPIC EXAMINATION: Casts: NONE SEEN /lpf

## 2017-04-04 LAB — CBC WITH DIFFERENTIAL/PLATELET
BASOS ABS: 0 10*3/uL (ref 0.0–0.2)
BASOS: 0 %
EOS (ABSOLUTE): 0 10*3/uL (ref 0.0–0.4)
Eos: 0 %
Hematocrit: 37.1 % (ref 34.0–46.6)
Hemoglobin: 12.3 g/dL (ref 11.1–15.9)
Immature Grans (Abs): 0 10*3/uL (ref 0.0–0.1)
Immature Granulocytes: 0 %
LYMPHS ABS: 1.7 10*3/uL (ref 0.7–3.1)
LYMPHS: 25 %
MCH: 27.7 pg (ref 26.6–33.0)
MCHC: 33.2 g/dL (ref 31.5–35.7)
MCV: 84 fL (ref 79–97)
MONOS ABS: 0.4 10*3/uL (ref 0.1–0.9)
Monocytes: 6 %
NEUTROS ABS: 4.6 10*3/uL (ref 1.4–7.0)
Neutrophils: 69 %
PLATELETS: 186 10*3/uL (ref 150–379)
RBC: 4.44 x10E6/uL (ref 3.77–5.28)
RDW: 14 % (ref 12.3–15.4)
WBC: 6.7 10*3/uL (ref 3.4–10.8)

## 2017-04-04 LAB — MONITOR DRUG PROFILE 14(MW)
Amphetamine Scrn, Ur: NEGATIVE ng/mL
BARBITURATE SCREEN URINE: NEGATIVE ng/mL
BENZODIAZEPINE SCREEN, URINE: NEGATIVE ng/mL
Buprenorphine, Urine: NEGATIVE ng/mL
CANNABINOIDS UR QL SCN: NEGATIVE ng/mL
Cocaine (Metab) Scrn, Ur: NEGATIVE ng/mL
Creatinine(Crt), U: 36.7 mg/dL (ref 20.0–300.0)
Fentanyl, Urine: NEGATIVE pg/mL
Meperidine Screen, Urine: NEGATIVE ng/mL
Methadone Screen, Urine: NEGATIVE ng/mL
OXYCODONE+OXYMORPHONE UR QL SCN: NEGATIVE ng/mL
Opiate Scrn, Ur: NEGATIVE ng/mL
Ph of Urine: 7.4 (ref 4.5–8.9)
Phencyclidine Qn, Ur: NEGATIVE ng/mL
Propoxyphene Scrn, Ur: NEGATIVE ng/mL
SPECIFIC GRAVITY: 1.009
Tramadol Screen, Urine: NEGATIVE ng/mL

## 2017-04-04 LAB — RPR: RPR: NONREACTIVE

## 2017-04-04 LAB — HEPATITIS B SURFACE ANTIGEN: HEP B S AG: NEGATIVE

## 2017-04-04 LAB — RH TYPE: RH TYPE: POSITIVE

## 2017-04-04 LAB — ANTIBODY SCREEN: ANTIBODY SCREEN: NEGATIVE

## 2017-04-04 LAB — HEMOGLOBIN A1C
Est. average glucose Bld gHb Est-mCnc: 100 mg/dL
Hgb A1c MFr Bld: 5.1 % (ref 4.8–5.6)

## 2017-04-04 LAB — VARICELLA ZOSTER ANTIBODY, IGG: Varicella zoster IgG: 596 {index}

## 2017-04-04 LAB — HIV ANTIBODY (ROUTINE TESTING W REFLEX): HIV Screen 4th Generation wRfx: NONREACTIVE

## 2017-04-04 LAB — RUBELLA SCREEN: RUBELLA: 5.33 {index} (ref >0.99)

## 2017-04-04 LAB — NICOTINE SCREEN, URINE: Cotinine Ql Scrn, Ur: NEGATIVE ng/mL

## 2017-04-04 LAB — ABO

## 2017-04-05 LAB — GC/CHLAMYDIA PROBE AMP
Chlamydia trachomatis, NAA: NEGATIVE
NEISSERIA GONORRHOEAE BY PCR: NEGATIVE

## 2017-04-07 LAB — URINE CULTURE, OB REFLEX

## 2017-04-07 LAB — CULTURE, OB URINE

## 2017-04-12 ENCOUNTER — Other Ambulatory Visit: Payer: Self-pay | Admitting: Obstetrics and Gynecology

## 2017-04-12 DIAGNOSIS — O2340 Unspecified infection of urinary tract in pregnancy, unspecified trimester: Principal | ICD-10-CM

## 2017-04-12 DIAGNOSIS — B951 Streptococcus, group B, as the cause of diseases classified elsewhere: Secondary | ICD-10-CM

## 2017-04-12 MED ORDER — AMOXICILLIN 500 MG PO CAPS: 500.0000 mg | ORAL_CAPSULE | Freq: Three times a day (TID) | ORAL | 2 refills | Status: DC

## 2017-04-13 ENCOUNTER — Ambulatory Visit (INDEPENDENT_AMBULATORY_CARE_PROVIDER_SITE_OTHER): Payer: BLUE CROSS/BLUE SHIELD | Admitting: Obstetrics and Gynecology

## 2017-04-13 VITALS — BP 101/79 | HR 109 | Temp 98.3°F | Wt 144.5 lb

## 2017-04-13 DIAGNOSIS — J0100 Acute maxillary sinusitis, unspecified: Secondary | ICD-10-CM

## 2017-04-13 DIAGNOSIS — Z3A09 9 weeks gestation of pregnancy: Secondary | ICD-10-CM | POA: Diagnosis not present

## 2017-04-13 LAB — POCT URINALYSIS DIPSTICK
BILIRUBIN UA: NEGATIVE
GLUCOSE UA: NEGATIVE
KETONES UA: 40
LEUKOCYTES UA: NEGATIVE
Nitrite, UA: NEGATIVE
PH UA: 6 (ref 5.0–8.0)
RBC UA: NEGATIVE
Spec Grav, UA: 1.025 (ref 1.010–1.025)
Urobilinogen, UA: 0.2 E.U./dL

## 2017-04-13 MED ORDER — CEFDINIR 300 MG PO CAPS: 300.0000 mg | ORAL_CAPSULE | Freq: Two times a day (BID) | ORAL | 1 refills | Status: DC

## 2017-04-13 NOTE — Progress Notes (Signed)
Subjective:     Patient ID: Selena White, female   DOB: 01-14-1991, 26 y.o.   MRN: [id number]  HPI Reports onset dry cough and sinus pressure 4 days ago, progressively worse and now has green drainage and sinus pain over frontal and maxillary sinus. Denies fever or sore throat.   Also is still nauseated all day and vomiting at least once. Now [redacted] weeks pregnant. And had 4# weight loss since last visit.  Review of Systems Negative except stated above in HPI    Objective:   Physical Exam A&Ox4 Pale and fatigue female in no acute distress Blood pressure 101/79, pulse (!) 109, temperature 98.3 F (36.8 C), weight 144 lb 8 oz (65.5 kg), last menstrual period 02/05/2017. HRR Lungs clear bilaterally Negative lymphadenopathy Ears with slight clear fluid behind each tympanic membrane Throat not red and no drainage Nasal passages boggy and red with green drainage noted Urinalysis    Component Value Date/Time   APPEARANCEUR Clear 04/03/2017 1650   GLUCOSEU Negative 04/03/2017 1650   BILIRUBINUR neg 04/13/2017 1038   BILIRUBINUR Negative 04/03/2017 1650   PROTEINUR trace 04/13/2017 1038   PROTEINUR Negative 04/03/2017 1650   UROBILINOGEN 0.2 04/13/2017 1038   NITRITE neg 04/13/2017 1038   NITRITE Negative 04/03/2017 1650   LEUKOCYTESUR Negative 04/13/2017 1038   LEUKOCYTESUR 1+ (A) 04/03/2017 1650       Assessment:     Acute sinusitis Nausea and vomiting before [redacted] weeks pregnant    Plan:     claritin daily with saline nasal spray bid omnicef 338m capsules BID x 7 days Diclegis samples given and explained use, side effects and expected outcomes. RTC as needed.  Denaly Gatling SCary CNM

## 2017-04-13 NOTE — Progress Notes (Signed)
OB WORK IN- x 4 days, sinus drainage, sinus pressure in face, cough- productive, B ear pain

## 2017-04-14 ENCOUNTER — Encounter: Payer: BLUE CROSS/BLUE SHIELD | Admitting: Obstetrics and Gynecology

## 2017-04-19 ENCOUNTER — Encounter: Payer: BLUE CROSS/BLUE SHIELD | Admitting: Obstetrics and Gynecology

## 2017-04-21 ENCOUNTER — Encounter: Payer: BLUE CROSS/BLUE SHIELD | Admitting: Obstetrics and Gynecology

## 2017-04-26 ENCOUNTER — Ambulatory Visit (INDEPENDENT_AMBULATORY_CARE_PROVIDER_SITE_OTHER): Payer: BLUE CROSS/BLUE SHIELD | Admitting: Obstetrics and Gynecology

## 2017-04-26 ENCOUNTER — Encounter: Payer: Self-pay | Admitting: Obstetrics and Gynecology

## 2017-04-26 VITALS — BP 116/71 | HR 103 | Wt 146.8 lb

## 2017-04-26 DIAGNOSIS — Z3481 Encounter for supervision of other normal pregnancy, first trimester: Secondary | ICD-10-CM

## 2017-04-26 LAB — POCT URINALYSIS DIPSTICK
Bilirubin, UA: NEGATIVE
Blood, UA: NEGATIVE
Glucose, UA: NEGATIVE
KETONES UA: 5
Leukocytes, UA: NEGATIVE
Nitrite, UA: NEGATIVE
PH UA: 6 (ref 5.0–8.0)
SPEC GRAV UA: 1.025 (ref 1.010–1.025)
UROBILINOGEN UA: 0.2 U/dL

## 2017-04-26 NOTE — Patient Instructions (Signed)

## 2017-04-26 NOTE — Progress Notes (Signed)
NOB- pt is doing well, still with nausea, diclegis is helping

## 2017-04-26 NOTE — Progress Notes (Signed)
NEW OB HISTORY AND PHYSICAL  SUBJECTIVE:       Selena White is a 26 y.o. G45P0000 female, Patient's last menstrual period was 02/05/2017 (exact date)., Estimated Date of Delivery: 11/12/17, [redacted]w[redacted]d, presents today for establishment of Prenatal Care. She has no unusual complaints and complains of nausea but Diclegis is helping.      Gynecologic History Patient's last menstrual period was 02/05/2017 (exact date). Normal Contraception: none Last Pap: 2017. Results were: normal  Obstetric History OB History  Gravida Para Term Preterm AB Living  1 0 0 0 0 0  SAB TAB Ectopic Multiple Live Births  0 0 0 0      # Outcome Date GA Lbr Len/2nd Weight Sex Delivery Anes PTL Lv  1 Current               Past Medical History:  Diagnosis Date  . Arrhythmia   . Hand tendonitis   . Heavy periods   . Mixed hypoglycemia   . Overweight   . Painful menstrual periods   . UTI (lower urinary tract infection)     No past surgical history on file.  Current Outpatient Prescriptions on File Prior to Visit  Medication Sig Dispense Refill  . Doxylamine-Pyridoxine 10-10 MG TBEC Day 1 & 2: Take 2 tablets at bedtime on an empty stomach. Day 3: If symptoms persist take 1 tablet in the morning and 2 tables at bedtime. Day 4: If symptoms persist take 1 tablet in the morning, 1 tablet mid-afternoon and 2 tablets at bedtime. Max Dose: 4 tablets a day. Pregnancy Category A 120 tablet 1  . folic acid (FOLVITE) 1 MG tablet Take 1 mg by mouth daily.    . cefdinir (OMNICEF) 300 MG capsule Take 1 capsule (300 mg total) by mouth 2 (two) times daily. (Patient not taking: Reported on 04/26/2017) 14 capsule 1  . doxylamine, Sleep, (UNISOM) 25 MG tablet Take 25 mg by mouth at bedtime as needed.    . Pren-Fe-Meth-FA-Omeg w/o A (PRIMACARE) 30-1-470 MG CAPS Take 1 tablet by mouth daily. (Patient not taking: Reported on 04/13/2017) 30 capsule 11   No current facility-administered medications on file prior to visit.     No  Known Allergies  Social History   Social History  . Marital status: Married    Spouse name: N/A  . Number of children: N/A  . Years of education: N/A   Occupational History  .  [employer]   Social History Main Topics  . Smoking status: Never Smoker  . Smokeless tobacco: Never Used  . Alcohol use No  . Drug use: No  . Sexual activity: Yes    Partners: Male   Other Topics Concern  . Not on file   Social History Narrative  . No narrative on file    Family History  Problem Relation Age of Onset  . Heart disease Father   . Cancer Maternal Grandmother        breast (mgm sister)    The following portions of the patient's history were reviewed and updated as appropriate: allergies, current medications, past OB history, past medical history, past surgical history, past family history, past social history, and problem list.    OBJECTIVE: Initial Physical Exam (New OB)  GENERAL APPEARANCE: alert, well appearing, in no apparent distress, oriented to person, place and time HEAD: normocephalic, atraumatic MOUTH: mucous membranes moist, pharynx normal without lesions and dental hygiene good THYROID: no thyromegaly or masses present BREASTS: not  examined LUNGS: clear to auscultation, no wheezes, rales or rhonchi, symmetric air entry HEART: regular rate and rhythm, no murmurs ABDOMEN: soft, nontender, nondistended, no abnormal masses, no epigastric pain, fundus not palpable and FHT present EXTREMITIES: no redness or tenderness in the calves or thighs SKIN: normal coloration and turgor, no rashes LYMPH NODES: no adenopathy palpable NEUROLOGIC: alert, oriented, normal speech, no focal findings or movement disorder noted  PELVIC EXAM not indicated  ASSESSMENT: Normal pregnancy  PLAN: Prenatal care DESIRES GENETIC SCREENING, LABS OBTAINED TODAY See orders

## 2017-05-10 ENCOUNTER — Telehealth: Payer: Self-pay

## 2017-05-10 DIAGNOSIS — R3915 Urgency of urination: Secondary | ICD-10-CM

## 2017-05-10 NOTE — Telephone Encounter (Signed)
Pt came by to drop off urine sample earlier this afternoon per MNS, pt c/o urinary urgency and uncomfortable feeling. Urinalysis done in office resulted in trace of leukocytes. Pt informed. Also informed urine sample was discarded before urine culture drawn. MNS aware. Pt may come by and drop off another sample or wait a few days and see if symptoms persist. Pt opted to wait a few days to see if symptoms improve and will drop urine off if needed.

## 2017-05-26 ENCOUNTER — Encounter: Payer: Self-pay | Admitting: Obstetrics and Gynecology

## 2017-05-26 ENCOUNTER — Ambulatory Visit (INDEPENDENT_AMBULATORY_CARE_PROVIDER_SITE_OTHER): Payer: BLUE CROSS/BLUE SHIELD | Admitting: Certified Nurse Midwife

## 2017-05-26 VITALS — BP 92/68 | HR 89 | Wt 146.1 lb

## 2017-05-26 DIAGNOSIS — R82998 Other abnormal findings in urine: Secondary | ICD-10-CM

## 2017-05-26 DIAGNOSIS — O219 Vomiting of pregnancy, unspecified: Secondary | ICD-10-CM

## 2017-05-26 DIAGNOSIS — Z3402 Encounter for supervision of normal first pregnancy, second trimester: Secondary | ICD-10-CM

## 2017-05-26 DIAGNOSIS — R8299 Other abnormal findings in urine: Secondary | ICD-10-CM

## 2017-05-26 LAB — POCT URINALYSIS DIPSTICK
Bilirubin, UA: NEGATIVE
GLUCOSE UA: NEGATIVE
Ketones, UA: 5
NITRITE UA: NEGATIVE
Spec Grav, UA: 1.02 (ref 1.010–1.025)
UROBILINOGEN UA: 0.2 U/dL
pH, UA: 6.5 (ref 5.0–8.0)

## 2017-05-26 MED ORDER — ONDANSETRON HCL 4 MG PO TABS: 4.0000 mg | ORAL_TABLET | Freq: Three times a day (TID) | ORAL | 0 refills | Status: DC | PRN

## 2017-05-26 NOTE — Progress Notes (Signed)
ROB-Pt doing well, reports return of nausea and vomiting after finishing diclegis samples. Discussed home treatment measures for morning sickness including ginger, lemon heads, peppermint lozenges, and sea bands. Rx Zofran. Coupons given for Dramamine and Diclegis. Genetic testing results given to patient in envelope today. Reviewed red flag symptoms and when to call. RTC x 4 weeks for anatomy scan and ROB.

## 2017-05-26 NOTE — Patient Instructions (Addendum)
Morning Sickness Morning sickness is when you feel sick to your stomach (nauseous) during pregnancy. This nauseous feeling may or may not come with vomiting. It often occurs in the morning but can be a problem any time of day. Morning sickness is most common during the first trimester, but it may continue throughout pregnancy. While morning sickness is unpleasant, it is usually harmless unless you develop severe and continual vomiting (hyperemesis gravidarum). This condition requires more intense treatment. What are the causes? The cause of morning sickness is not completely known but seems to be related to normal hormonal changes that occur in pregnancy. What increases the risk? You are at greater risk if you:  Experienced nausea or vomiting before your pregnancy.  Had morning sickness during a previous pregnancy.  Are pregnant with more than one baby, such as twins.  How is this treated? Do not use any medicines (prescription, over-the-counter, or herbal) for morning sickness without first talking to your health care provider. Your health care provider may prescribe or recommend:  Vitamin B6 supplements.  Anti-nausea medicines.  The herbal medicine ginger.  Follow these instructions at home:  Only take over-the-counter or prescription medicines as directed by your health care provider.  Taking multivitamins before getting pregnant can prevent or decrease the severity of morning sickness in most women.  Eat a piece of dry toast or unsalted crackers before getting out of bed in the morning.  Eat five or six small meals a day.  Eat dry and bland foods (rice, baked potato). Foods high in carbohydrates are often helpful.  Do not drink liquids with your meals. Drink liquids between meals.  Avoid greasy, fatty, and spicy foods.  Get someone to cook for you if the smell of any food causes nausea and vomiting.  If you feel nauseous after taking prenatal vitamins, take the vitamins at  night or with a snack.  Snack on protein foods (nuts, yogurt, cheese) between meals if you are hungry.  Eat unsweetened gelatins for desserts.  Wearing an acupressure wristband (worn for sea sickness) may be helpful.  Acupuncture may be helpful.  Do not smoke.  Get a humidifier to keep the air in your house free of odors.  Get plenty of fresh air. Contact a health care provider if:  Your home remedies are not working, and you need medicine.  You feel dizzy or lightheaded.  You are losing weight. Get help right away if:  You have persistent and uncontrolled nausea and vomiting.  You pass out (faint). This information is not intended to replace advice given to you by your health care provider. Make sure you discuss any questions you have with your health care provider. Document Released: 01/19/2007 Document Revised: 05/05/2016 Document Reviewed: 05/15/2013 Elsevier Interactive Patient Education  2017 [place]. Common Medications Safe in Pregnancy  Acne:      Constipation:  Benzoyl Peroxide     Colace  Clindamycin      Dulcolax Suppository  Topica Erythromycin     Fibercon  Salicylic Acid      Metamucil         Miralax AVOID:        Senakot   Accutane    Cough:  Retin-A       Cough Drops  Tetracycline      Phenergan w/ Codeine if Rx  Minocycline      Robitussin (Plain & DM)  Antibiotics:     Crabs/Lice:  Ceclor       RID  Cephalosporins    AVOID:  E-Mycins      Kwell  Keflex  Macrobid/Macrodantin   Diarrhea:  Penicillin      Kao-Pectate  Zithromax      Imodium AD         PUSH FLUIDS AVOID:       Cipro     Fever:  Tetracycline      Tylenol (Regular or Extra  Minocycline       Strength)  Levaquin      Extra Strength-Do not          Exceed 8 tabs/24 hrs Caffeine:        <256m/day (equiv. To 1 cup of coffee or  approx. 3 12 oz  sodas)         Gas: Cold/Hayfever:       Gas-X  Benadryl      Mylicon  Claritin       Phazyme  **Claritin-D        Chlor-Trimeton    Headaches:  Dimetapp      ASA-Free Excedrin  Drixoral-Non-Drowsy     Cold Compress  Mucinex (Guaifenasin)     Tylenol (Regular or Extra  Sudafed/Sudafed-12 Hour     Strength)  **Sudafed PE Pseudoephedrine   Tylenol Cold & Sinus     Vicks Vapor Rub  Zyrtec  **AVOID if Problems With Blood Pressure         Heartburn: Avoid lying down for at least 1 hour after meals  Aciphex      Maalox     Rash:  Milk of Magnesia     Benadryl    Mylanta       1% Hydrocortisone Cream  Pepcid  Pepcid Complete   Sleep Aids:  Prevacid      Ambien   Prilosec       Benadryl  Rolaids       Chamomile Tea  Tums (Limit 4/day)     Unisom  Zantac       Tylenol PM         Warm milk-add vanilla or  Hemorrhoids:       Sugar for taste  Anusol/Anusol H.C.  (RX: Analapram 2.5%)  Sugar Substitutes:  Hydrocortisone OTC     Ok in moderation  Preparation H      Tucks        Vaseline lotion applied to tissue with wiping    Herpes:     Throat:  Acyclovir      Oragel  Famvir  Valtrex     Vaccines:         Flu Shot Leg Cramps:       *Gardasil  Benadryl      Hepatitis A         Hepatitis B Nasal Spray:       Pneumovax  Saline Nasal Spray     Polio Booster         Tetanus Nausea:       Tuberculosis test or PPD  Vitamin B6 25 mg TID   AVOID:    Dramamine      *Gardasil  Emetrol       Live Poliovirus  Ginger Root 250 mg QID    MMR (measles, mumps &  High Complex Carbs @ Bedtime    rebella)  Sea Bands-Accupressure    Varicella (Chickenpox)  Unisom 1/2 tab TID     *No known complications           If received before  Pain:         Known pregnancy;   Darvocet       Resume series after  Lortab        Delivery  Percocet    Yeast:   Tramadol      Femstat  Tylenol 3      Gyne-lotrimin  Ultram       Monistat  Vicodin           MISC:         All  Sunscreens           Hair Coloring/highlights          Insect Repellant's          (Including DEET)         Mystic Tans

## 2017-05-28 LAB — URINE CULTURE: ORGANISM ID, BACTERIA: NO GROWTH

## 2017-06-21 ENCOUNTER — Ambulatory Visit (INDEPENDENT_AMBULATORY_CARE_PROVIDER_SITE_OTHER): Payer: BLUE CROSS/BLUE SHIELD

## 2017-06-21 ENCOUNTER — Ambulatory Visit (INDEPENDENT_AMBULATORY_CARE_PROVIDER_SITE_OTHER): Payer: BLUE CROSS/BLUE SHIELD | Admitting: Obstetrics and Gynecology

## 2017-06-21 DIAGNOSIS — Z3402 Encounter for supervision of normal first pregnancy, second trimester: Secondary | ICD-10-CM | POA: Diagnosis not present

## 2017-06-21 NOTE — Progress Notes (Signed)
Anatomy scan:  Indications:Anatomy U/S Findings:  Singleton intrauterine pregnancy is visualized with FHR at 152 BPM. Biometrics give an (U/S) Gestational age of [redacted] weeks and an (U/S) EDD of 11/08/17; this correlates with the clinically established EDD of 11/12/17.  Fetal presentation is Vertex.  EFW: 334g (12oz). Placenta: Posterior, grade 0, 5.2 cm from internal os, central cord insert. AFI: Adequate with MVP of 4.6cm  Anatomic survey is complete and normal; Gender - female  .   Ovaries are not seen Survey of the adnexa demonstrates no adnexal masses. There is no free peritoneal fluid in the cul de sac.  Impression: 1. 20 0/7 week Viable Singleton Intrauterine pregnancy by U/S. 2. (U/S) EDD is consistent with Clinically established (LMP) EDD of 11/12/17. 3. Normal Anatomy Scan

## 2017-06-23 ENCOUNTER — Other Ambulatory Visit: Payer: BLUE CROSS/BLUE SHIELD

## 2017-06-23 ENCOUNTER — Encounter: Payer: BLUE CROSS/BLUE SHIELD | Admitting: Obstetrics and Gynecology

## 2017-06-27 ENCOUNTER — Inpatient Hospital Stay: Payer: PRIVATE HEALTH INSURANCE

## 2017-06-27 LAB — URINALYSIS
Bilirubin Urine: NEGATIVE
Blood, Urine: NEGATIVE
Glucose, Ur: NEGATIVE mg/dL
Ketones, Urine: NEGATIVE mg/dL
Leukocyte Esterase, Urine: NEGATIVE
Nitrite, Urine: NEGATIVE
Protein, UA: NEGATIVE mg/dL
Specific Gravity, UA: 1.015 (ref 1.005–1.030)
Urobilinogen, Urine: 0.2 E.U./dL (ref <2.0)
pH, UA: 8 (ref 5.0–9.0)

## 2017-06-27 NOTE — H&P (Signed)
Department of Obstetrics and Gynecology  Physician Assistant Obstetrics History and Physical      HISTORY OF PRESENT ILLNESS:      The patient is a 26 y.o. gravida 4 parity 1 at 7529 weeks' 2 days' gestation presents to the antepartal floor c/o contractions that began at 09:51 am that are approximately every 10 minutes.  She reports no fetal movement since 05:30 am.  When EFM straps were placed, she reported feeling fetal movement.  Has felt "wet" for 2 days.  Amnisure performed and is negative.  Since here, the patient states her contractions have decreased.  She denies any urinary symptoms.    Current obstetric history is significant for:  Previous Cesarean section  [facility]    Estimated Due Date:  09/10/2017  Contractions: Yes  Leaking of fluid: Yes  Time of ROM:   X 2 days  Bleeding:  No  Perceived fetal movement: See above      PAST OB HISTORY:  OB History   Gravida Para Term Preterm AB Living   4 1 1   2 1    SAB TAB Ectopic Molar Multiple Live Births   1         1      # Outcome Date GA Lbr Len/2nd Weight Sex Delivery Anes PTL Lv   4 Current            3 SAB 06/27/13           2 AB 06/28/11           1 Term 01/17/10 1936w4d   F CS-LTranv   LIV              Pre-eclampsia:  Yes      C-Section:  Yes       D & C:  No      Cerclage:  No      LEEP:  No      Myomectomy:  No      Preterm Labor: No    Past Medical History:    No past medical history on file.     Past Surgical History:    Procedure Laterality Date   . CESAREAN SECTION, LOW TRANSVERSE          Social History:    Reports that she has been smoking Cigarettes.  She started smoking about 8 years ago. She has never used smokeless tobacco. She reports that she does not drink alcohol or use drugs.     Medications Prior to Admission:  Prescriptions Prior to Admission: Prenatal Vit-Fe Fumarate-FA (PRENATAL 1+1 PO), Take 1 capsule by mouth daily    Allergies:  Penicillins    Review of Systems:   Ears, nose, mouth, throat, and face:  negative  Respiratory: negative  Cardiovascular: negative  Gastrointestinal: negative  Genitourinary:negative  Integument/breast: negative  Hematologic/lymphatic: negative  Musculoskeletal:negative  Neurological: negative  Behavioral/Psych: negative  Endocrine: negative  Allergic/Immunologic: negative  Pertinent (+) & (-)s addressed in HPI    PHYSICAL EXAM:  BP 118/66   Pulse 91   Temp 97.7 F (36.5 C) (Oral)   Resp 18   Ht 5\' 9"  (1.753 m)   Wt 163 lb (73.9 kg)   LMP 12/04/2016   BMI 24.07 kg/m     General appearance: Comfortable  Lungs:  CTA bilaterally, good excursion  Heart:  Regular Rate & Rhythm, no murmur noted  Abdomen:  Soft, non-tender, gravid  Uterus: soft, nontender  Fetal heart rate:  Baseline Heart Rate 130 bpm: FHR tracing meets criteria of reactivity for gestational age per Dr. Maris Berger  Cervix:  DILATION:closed  Contraction frequency: rare  Membranes:  Intact: Amnisure (-)  Extremities: (-) edema       ASSESSMENT:  Patient was seen and examined by myself as well as by Dr. Neldon Newport.  26 yo G4 P1 A2 IUP at 29 weeks' 2 days' gestation    "No fetal movement", improved  ? leakage of fluid  ? contractions    R/o ROM: Amnisure (-)    Previous Cesarean section  PIH in previous pregnancy    FHR tracing appropriate for gestational age         Plan: I discussed above with Dr. Allena Katz:  Orders per her:  EFM  Amnisure  Outpatient  U/a  Encourage oral hydration      Electronically signed by Kathie Rhodes, PA-C on 06/27/2017 at 1:54 PM

## 2017-06-27 NOTE — Progress Notes (Signed)
Dr. Allena KatzPatel called and u/a reviewed. Orders received to discharge

## 2017-06-27 NOTE — H&P (Signed)
CHIEF COMPLAINT:  contractions, decreased fetal movement    HISTORY OF PRESENT ILLNESS:      The patient is a 26 y.o. female at [redacted]w[redacted]d.  OB History     Gravida Para Term Preterm AB Living    4 1 1   2 1     SAB TAB Ectopic Molar Multiple Live Births    1         1      Patient presents with a chief complaint as above and is being admitted for observation    Estimated Due Date: Estimated Date of Delivery: 09/10/17    PRENATAL CARE:    Complicated by: none    PAST OB HISTORY  OB History     Gravida Para Term Preterm AB Living    4 1 1   2 1     SAB TAB Ectopic Molar Multiple Live Births    1         1          Past Medical History:    No past medical history on file.  Past Surgical History:        Procedure Laterality Date   ??? CESAREAN SECTION, LOW TRANSVERSE       Allergies:  Penicillins  Social History:    Social History     Social History   ??? Marital status: Single     Spouse name: N/A   ??? Number of children: N/A   ??? Years of education: N/A     Occupational History   ??? Not on file.     Social History Main Topics   ??? Smoking status: Current Some Day Smoker     Types: Cigarettes     Start date: 06/27/2009   ??? Smokeless tobacco: Never Used   ??? Alcohol use No   ??? Drug use: No   ??? Sexual activity: Not on file     Other Topics Concern   ??? Not on file     Social History Narrative   ??? No narrative on file     Family History:   No family history on file.  Medications Prior to Admission:  Prescriptions Prior to Admission: Prenatal Vit-Fe Fumarate-FA (PRENATAL 1+1 PO), Take 1 capsule by mouth daily    REVIEW OF SYSTEMS:    CONSTITUTIONAL:  negative  RESPIRATORY:  negative  CARDIOVASCULAR:  negative  GASTROINTESTINAL:  negative  ALLERGIC/IMMUNOLOGIC:  negative  NEUROLOGICAL:  negative  BEHAVIOR/PSYCH:  negative    PHYSICAL EXAM:  Vitals:    06/27/17 1310 06/27/17 1350   BP: 118/66    Pulse: 91    Resp: 18    Temp: 97.7 ??F (36.5 ??C)    TempSrc: Oral    Weight:  163 lb (73.9 kg)   Height:  5\' 9"  (1.753 m)     General appearance:   awake, alert, cooperative, no apparent distress, and appears stated age  Neurologic:  Awake, alert, oriented to name, place and time.    Lungs:  No increased work of breathing, good air exchange  Abdomen:  Soft, non tender, gravid, consistent with her gestational age  Fetal heart rate:  Reassuring.  Pelvis:  Adequate pelvis  Cervix: Closed Long firm -5  Contraction frequency:  20 minutes    Membranes:  Intact/amniosure neg    ASSESSMENT AND PLAN:    Labor: IUP 29 weeks   Reactive tracing no ctx no srom   Fetus: Reassuring    Other: Dr Allena Katz notified  Orders by her       Electronically signed by Orvan JulyAntoine El-Hayek, MD on 06/27/2017 at 2:43 PM

## 2017-06-27 NOTE — Progress Notes (Signed)
Presented to labor and delivery with complaints of contractions that started today at 0951 per patient that were every 10 minutes per patient   States no fetal movement since 0530   Placed on external monitor and FHT at 140's   Patient now admits to fetal movement.  Denies vaginal bleeding but admits to feeling "wet" for 2 days. Will perform an amnisure   Admits to being a previous c-section for preeclampsia

## 2017-06-27 NOTE — Progress Notes (Signed)
Pt discharged with written instructions. Pt acknowledged understanding of all instructions. Pt ambulatory and in no distress upon discharge

## 2017-06-27 NOTE — Discharge Instructions (Signed)
Home Undelivered Discharge Instructions    After Discharge Orders:    Follow up at next scheduled appointment             Diet:  normal diet as tolerated Drink 8-10 8oz glasses of water daily      Rest: normal activity as tolerated    Other instructions: Do kick counts once a day on your baby. Choose the time of day your baby is most active. Get in a comfortable lying or sitting position and time how long it takes to feel 10 kicks, twists, turns, swishes, or rolls. Call your physician or midwife if there have not been 10 kicks in 2 hours    Call physician or midwife, return to Labor and Delivery, call 911, or go to the nearest Emergency Room if: increased leakage or fluid, contractions more than  6 per  1 hour, decreased fetal movement, persistent low back pain or cramping, bleeding from vaginal area, difficulty urinating, pain with urination or difficulty breathing

## 2017-07-19 ENCOUNTER — Ambulatory Visit (INDEPENDENT_AMBULATORY_CARE_PROVIDER_SITE_OTHER): Payer: BLUE CROSS/BLUE SHIELD | Admitting: Obstetrics and Gynecology

## 2017-07-19 VITALS — BP 104/75 | HR 86 | Wt 153.3 lb

## 2017-07-19 DIAGNOSIS — Z3492 Encounter for supervision of normal pregnancy, unspecified, second trimester: Secondary | ICD-10-CM

## 2017-07-20 ENCOUNTER — Telehealth: Payer: Self-pay | Admitting: Obstetrics and Gynecology

## 2017-07-20 LAB — POCT URINALYSIS DIPSTICK
BILIRUBIN UA: NEGATIVE
GLUCOSE UA: NEGATIVE
Ketones, UA: NEGATIVE
Leukocytes, UA: NEGATIVE
Nitrite, UA: NEGATIVE
Protein, UA: NEGATIVE
RBC UA: NEGATIVE
SPEC GRAV UA: 1.015 (ref 1.010–1.025)
Urobilinogen, UA: 0.2 E.U./dL
pH, UA: 6.5 (ref 5.0–8.0)

## 2017-07-20 NOTE — Progress Notes (Signed)
ROB- pt is c/o irregular heart beat, otherwise she is doing well

## 2017-07-20 NOTE — Telephone Encounter (Signed)
Patient was unable to schedule future appointments and check out 07/19/2017. I called and lvm for patient to call back and schedule appointments 07/20/2017 2:05pm. Thank you.

## 2017-07-20 NOTE — Progress Notes (Signed)
ROB- encouraged enrolling in classes; counseled on HR changes in pregnancy and warning signs. Reviewed normal anatomy scan. glucola next visit.

## 2017-08-16 ENCOUNTER — Other Ambulatory Visit: Payer: BLUE CROSS/BLUE SHIELD

## 2017-08-16 ENCOUNTER — Ambulatory Visit (INDEPENDENT_AMBULATORY_CARE_PROVIDER_SITE_OTHER): Payer: BLUE CROSS/BLUE SHIELD | Admitting: Obstetrics and Gynecology

## 2017-08-16 VITALS — BP 117/62 | HR 104 | Wt 161.1 lb

## 2017-08-16 DIAGNOSIS — Z3492 Encounter for supervision of normal pregnancy, unspecified, second trimester: Secondary | ICD-10-CM

## 2017-08-16 DIAGNOSIS — Z23 Encounter for immunization: Secondary | ICD-10-CM

## 2017-08-16 DIAGNOSIS — Z131 Encounter for screening for diabetes mellitus: Secondary | ICD-10-CM

## 2017-08-16 LAB — POCT URINALYSIS DIPSTICK
Bilirubin, UA: NEGATIVE
Blood, UA: NEGATIVE
Glucose, UA: NEGATIVE
Ketones, UA: NEGATIVE
LEUKOCYTES UA: NEGATIVE
NITRITE UA: NEGATIVE
PH UA: 6 (ref 5.0–8.0)
PROTEIN UA: NEGATIVE
Spec Grav, UA: 1.01 (ref 1.010–1.025)
Urobilinogen, UA: 0.2 E.U./dL

## 2017-08-16 MED ORDER — TETANUS-DIPHTH-ACELL PERTUSSIS 5-2.5-18.5 LF-MCG/0.5 IM SUSP
0.5000 mL | Freq: Once | INTRAMUSCULAR | Status: AC
  Administered 2017-08-16: 0.5 mL via INTRAMUSCULAR

## 2017-08-16 NOTE — Progress Notes (Signed)
ROB- glucola done, blood consent signed, tdap given, pt is having pain in her low back

## 2017-08-16 NOTE — Patient Instructions (Signed)
Third Trimester of Pregnancy The third trimester is from week 28 through week 40 (months 7 through 9). The third trimester is a time when the unborn baby (fetus) is growing rapidly. At the end of the ninth month, the fetus is about 20 inches in length and weighs 6-10 pounds. Body changes during your third trimester Your body will continue to go through many changes during pregnancy. The changes vary from woman to woman. During the third trimester:  Your weight will continue to increase. You can expect to gain 25-35 pounds (11-16 kg) by the end of the pregnancy.  You may begin to get stretch marks on your hips, abdomen, and breasts.  You may urinate more often because the fetus is moving lower into your pelvis and pressing on your bladder.  You may develop or continue to have heartburn. This is caused by increased hormones that slow down muscles in the digestive tract.  You may develop or continue to have constipation because increased hormones slow digestion and cause the muscles that push waste through your intestines to relax.  You may develop hemorrhoids. These are swollen veins (varicose veins) in the rectum that can itch or be painful.  You may develop swollen, bulging veins (varicose veins) in your legs.  You may have increased body aches in the pelvis, back, or thighs. This is due to weight gain and increased hormones that are relaxing your joints.  You may have changes in your hair. These can include thickening of your hair, rapid growth, and changes in texture. Some women also have hair loss during or after pregnancy, or hair that feels dry or thin. Your hair will most likely return to normal after your baby is born.  Your breasts will continue to grow and they will continue to become tender. A yellow fluid (colostrum) may leak from your breasts. This is the first milk you are producing for your baby.  Your belly button may stick out.  You may notice more swelling in your hands,  face, or ankles.  You may have increased tingling or numbness in your hands, arms, and legs. The skin on your belly may also feel numb.  You may feel short of breath because of your expanding uterus.  You may have more problems sleeping. This can be caused by the size of your belly, increased need to urinate, and an increase in your body's metabolism.  You may notice the fetus "dropping," or moving lower in your abdomen (lightening).  You may have increased vaginal discharge.  You may notice your joints feel loose and you may have pain around your pelvic bone.  What to expect at prenatal visits You will have prenatal exams every 2 weeks until week 36. Then you will have weekly prenatal exams. During a routine prenatal visit:  You will be weighed to make sure you and the baby are growing normally.  Your blood pressure will be taken.  Your abdomen will be measured to track your baby's growth.  The fetal heartbeat will be listened to.  Any test results from the previous visit will be discussed.  You may have a cervical check near your due date to see if your cervix has softened or thinned (effaced).  You will be tested for Group B streptococcus. This happens between 35 and 37 weeks.  Your health care provider may ask you:  What your birth plan is.  How you are feeling.  If you are feeling the baby move.  If you have had   any abnormal symptoms, such as leaking fluid, bleeding, severe headaches, or abdominal cramping.  If you are using any tobacco products, including cigarettes, chewing tobacco, and electronic cigarettes.  If you have any questions.  Other tests or screenings that may be performed during your third trimester include:  Blood tests that check for low iron levels (anemia).  Fetal testing to check the health, activity level, and growth of the fetus. Testing is done if you have certain medical conditions or if there are problems during the  pregnancy.  Nonstress test (NST). This test checks the health of your baby to make sure there are no signs of problems, such as the baby not getting enough oxygen. During this test, a belt is placed around your belly. The baby is made to move, and its heart rate is monitored during movement.  What is false labor? False labor is a condition in which you feel small, irregular tightenings of the muscles in the womb (contractions) that usually go away with rest, changing position, or drinking water. These are called Braxton Hicks contractions. Contractions may last for hours, days, or even weeks before true labor sets in. If contractions come at regular intervals, become more frequent, increase in intensity, or become painful, you should see your health care provider. What are the signs of labor?  Abdominal cramps.  Regular contractions that start at 10 minutes apart and become stronger and more frequent with time.  Contractions that start on the top of the uterus and spread down to the lower abdomen and back.  Increased pelvic pressure and dull back pain.  A watery or bloody mucus discharge that comes from the vagina.  Leaking of amniotic fluid. This is also known as your "water breaking." It could be a slow trickle or a gush. Let your health care provider know if it has a color or strange odor. If you have any of these signs, call your health care provider right away, even if it is before your due date. Follow these instructions at home: Medicines  Follow your health care provider's instructions regarding medicine use. Specific medicines may be either safe or unsafe to take during pregnancy.  Take a prenatal vitamin that contains at least 600 micrograms (mcg) of folic acid.  If you develop constipation, try taking a stool softener if your health care provider approves. Eating and drinking  Eat a balanced diet that includes fresh fruits and vegetables, whole grains, good sources of protein  such as meat, eggs, or tofu, and low-fat dairy. Your health care provider will help you determine the amount of weight gain that is right for you.  Avoid raw meat and uncooked cheese. These carry germs that can cause birth defects in the baby.  If you have low calcium intake from food, talk to your health care provider about whether you should take a daily calcium supplement.  Eat four or five small meals rather than three large meals a day.  Limit foods that are high in fat and processed sugars, such as fried and sweet foods.  To prevent constipation: ? Drink enough fluid to keep your urine clear or pale yellow. ? Eat foods that are high in fiber, such as fresh fruits and vegetables, whole grains, and beans. Activity  Exercise only as directed by your health care provider. Most women can continue their usual exercise routine during pregnancy. Try to exercise for 30 minutes at least 5 days a week. Stop exercising if you experience uterine contractions.  Avoid heavy   lifting.  Do not exercise in extreme heat or humidity, or at high altitudes.  Wear low-heel, comfortable shoes.  Practice good posture.  You may continue to have sex unless your health care provider tells you otherwise. Relieving pain and discomfort  Take frequent breaks and rest with your legs elevated if you have leg cramps or low back pain.  Take warm sitz baths to soothe any pain or discomfort caused by hemorrhoids. Use hemorrhoid cream if your health care provider approves.  Wear a good support bra to prevent discomfort from breast tenderness.  If you develop varicose veins: ? Wear support pantyhose or compression stockings as told by your healthcare provider. ? Elevate your feet for 15 minutes, 3-4 times a day. Prenatal care  Write down your questions. Take them to your prenatal visits.  Keep all your prenatal visits as told by your health care provider. This is important. Safety  Wear your seat belt at  all times when driving.  Make a list of emergency phone numbers, including numbers for family, friends, the hospital, and police and fire departments. General instructions  Avoid cat litter boxes and soil used by cats. These carry germs that can cause birth defects in the baby. If you have a cat, ask someone to clean the litter box for you.  Do not travel far distances unless it is absolutely necessary and only with the approval of your health care provider.  Do not use hot tubs, steam rooms, or saunas.  Do not drink alcohol.  Do not use any products that contain nicotine or tobacco, such as cigarettes and e-cigarettes. If you need help quitting, ask your health care provider.  Do not use any medicinal herbs or unprescribed drugs. These chemicals affect the formation and growth of the baby.  Do not douche or use tampons or scented sanitary pads.  Do not cross your legs for long periods of time.  To prepare for the arrival of your baby: ? Take prenatal classes to understand, practice, and ask questions about labor and delivery. ? Make a trial run to the hospital. ? Visit the hospital and tour the maternity area. ? Arrange for maternity or paternity leave through employers. ? Arrange for family and friends to take care of pets while you are in the hospital. ? Purchase a rear-facing car seat and make sure you know how to install it in your car. ? Pack your hospital bag. ? Prepare the baby's nursery. Make sure to remove all pillows and stuffed animals from the baby's crib to prevent suffocation.  Visit your dentist if you have not gone during your pregnancy. Use a soft toothbrush to brush your teeth and be gentle when you floss. Contact a health care provider if:  You are unsure if you are in labor or if your water has broken.  You become dizzy.  You have mild pelvic cramps, pelvic pressure, or nagging pain in your abdominal area.  You have lower back pain.  You have persistent  nausea, vomiting, or diarrhea.  You have an unusual or bad smelling vaginal discharge.  You have pain when you urinate. Get help right away if:  Your water breaks before 37 weeks.  You have regular contractions less than 5 minutes apart before 37 weeks.  You have a fever.  You are leaking fluid from your vagina.  You have spotting or bleeding from your vagina.  You have severe abdominal pain or cramping.  You have rapid weight loss or weight gain.    You have shortness of breath with chest pain.  You notice sudden or extreme swelling of your face, hands, ankles, feet, or legs.  Your baby makes fewer than 10 movements in 2 hours.  You have severe headaches that do not go away when you take medicine.  You have vision changes. Summary  The third trimester is from week 28 through week 40, months 7 through 9. The third trimester is a time when the unborn baby (fetus) is growing rapidly.  During the third trimester, your discomfort may increase as you and your baby continue to gain weight. You may have abdominal, leg, and back pain, sleeping problems, and an increased need to urinate.  During the third trimester your breasts will keep growing and they will continue to become tender. A yellow fluid (colostrum) may leak from your breasts. This is the first milk you are producing for your baby.  False labor is a condition in which you feel small, irregular tightenings of the muscles in the womb (contractions) that eventually go away. These are called Braxton Hicks contractions. Contractions may last for hours, days, or even weeks before true labor sets in.  Signs of labor can include: abdominal cramps; regular contractions that start at 10 minutes apart and become stronger and more frequent with time; watery or bloody mucus discharge that comes from the vagina; increased pelvic pressure and dull back pain; and leaking of amniotic fluid. This information is not intended to replace advice  given to you by your health care provider. Make sure you discuss any questions you have with your health care provider. Document Released: 11/22/2001 Document Revised: 05/05/2016 Document Reviewed: 01/29/2013 Elsevier Interactive Patient Education  2017 [employer]  

## 2017-08-16 NOTE — Progress Notes (Signed)
ROB-reports low mid back pain and tenderness for few weeks-tender to touch, massage doesn't help-to see chiropractor. glucola done. Tdap given, starts classes in few weeks. Plans circumcision and breast feeding.

## 2017-08-17 ENCOUNTER — Encounter: Payer: Self-pay | Admitting: Obstetrics and Gynecology

## 2017-08-17 ENCOUNTER — Other Ambulatory Visit: Payer: Self-pay | Admitting: Obstetrics and Gynecology

## 2017-08-17 DIAGNOSIS — O99810 Abnormal glucose complicating pregnancy: Secondary | ICD-10-CM

## 2017-08-17 LAB — CBC
HEMOGLOBIN: 10.3 g/dL — AB (ref 11.1–15.9)
Hematocrit: 31 % — ABNORMAL LOW (ref 34.0–46.6)
MCH: 29.6 pg (ref 26.6–33.0)
MCHC: 33.2 g/dL (ref 31.5–35.7)
MCV: 89 fL (ref 79–97)
Platelets: 162 10*3/uL (ref 150–379)
RBC: 3.48 x10E6/uL — AB (ref 3.77–5.28)
RDW: 13.2 % (ref 12.3–15.4)
WBC: 8.8 10*3/uL (ref 3.4–10.8)

## 2017-08-17 LAB — GLUCOSE, 1 HOUR GESTATIONAL: GESTATIONAL DIABETES SCREEN: 142 mg/dL — AB (ref 65–139)

## 2017-08-17 MED ORDER — FUSION PLUS PO CAPS: 1.0000 | ORAL_CAPSULE | Freq: Every day | ORAL | 1 refills | Status: DC

## 2017-08-21 ENCOUNTER — Inpatient Hospital Stay
Admit: 2017-08-21 | Discharge: 2017-08-23 | Disposition: A | Discharge status: Home / Self Care | Payer: PRIVATE HEALTH INSURANCE | Attending: Obstetrics & Gynecology | Admitting: Obstetrics & Gynecology

## 2017-08-21 DIAGNOSIS — O134 Gestational [pregnancy-induced] hypertension without significant proteinuria, complicating childbirth: Secondary | ICD-10-CM

## 2017-08-21 LAB — CBC
Hematocrit: 35.6 % (ref 34.0–48.0)
Hemoglobin: 11.7 g/dL (ref 11.5–15.5)
MCH: 29.5 pg (ref 26.0–35.0)
MCHC: 32.9 % (ref 32.0–34.5)
MCV: 89.9 fL (ref 80.0–99.9)
MPV: 10.9 fL (ref 7.0–12.0)
Platelets: 299 E9/L (ref 130–450)
RBC: 3.96 E12/L (ref 3.50–5.50)
RDW: 14.2 fL (ref 11.5–15.0)
WBC: 10.4 E9/L (ref 4.5–11.5)

## 2017-08-21 LAB — COMPREHENSIVE METABOLIC PANEL
ALT: 40 U/L — ABNORMAL HIGH (ref 0–32)
ALT: 40 U/L — ABNORMAL HIGH (ref 0–32)
AST: 36 U/L — ABNORMAL HIGH (ref 0–31)
AST: 33 U/L — ABNORMAL HIGH (ref 0–31)
Albumin: 3.6 g/dL (ref 3.5–5.2)
Albumin: 3.4 g/dL — ABNORMAL LOW (ref 3.5–5.2)
Alkaline Phosphatase: 266 U/L — ABNORMAL HIGH (ref 35–104)
Alkaline Phosphatase: 261 U/L — ABNORMAL HIGH (ref 35–104)
Anion Gap: 13 mmol/L (ref 7–16)
Anion Gap: 12 mmol/L (ref 7–16)
BUN: 9 mg/dL (ref 6–20)
BUN: 7 mg/dL (ref 6–20)
CO2: 22 mmol/L (ref 22–29)
CO2: 23 mmol/L (ref 22–29)
Calcium: 9.5 mg/dL (ref 8.6–10.2)
Calcium: 9.2 mg/dL (ref 8.6–10.2)
Chloride: 99 mmol/L (ref 98–107)
Chloride: 102 mmol/L (ref 98–107)
Creatinine: 0.5 mg/dL (ref 0.5–1.0)
Creatinine: 0.5 mg/dL (ref 0.5–1.0)
GFR African American: >60
GFR African American: >60
GFR Non-African American: >60 mL/min/{1.73_m2} (ref >=60)
GFR Non-African American: >60 mL/min/{1.73_m2} (ref >=60)
Glucose: 88 mg/dL (ref 74–109)
Glucose: 107 mg/dL (ref 74–109)
Potassium: 4 mmol/L (ref 3.5–5.0)
Potassium: 4.1 mmol/L (ref 3.5–5.0)
Sodium: 134 mmol/L (ref 132–146)
Sodium: 137 mmol/L (ref 132–146)
Total Bilirubin: 0.5 mg/dL (ref 0.0–1.2)
Total Bilirubin: 0.5 mg/dL (ref 0.0–1.2)
Total Protein: 7.2 g/dL (ref 6.4–8.3)
Total Protein: 7.2 g/dL (ref 6.4–8.3)

## 2017-08-21 LAB — URINALYSIS
Bilirubin Urine: NEGATIVE
Blood, Urine: NEGATIVE
Glucose, Ur: NEGATIVE mg/dL
Ketones, Urine: NEGATIVE mg/dL
Leukocyte Esterase, Urine: NEGATIVE
Nitrite, Urine: NEGATIVE
Protein, UA: NEGATIVE mg/dL
Specific Gravity, UA: 1.02 (ref 1.005–1.030)
Urobilinogen, Urine: 0.2 E.U./dL (ref <2.0)
pH, UA: 6 (ref 5.0–9.0)

## 2017-08-21 LAB — MICROSCOPIC URINALYSIS

## 2017-08-21 LAB — URINE DRUG SCREEN
Amphetamine Screen, Urine: NOT DETECTED (ref <1000)
Barbiturate Screen, Ur: NOT DETECTED (ref <200)
Benzodiazepine Screen, Urine: NOT DETECTED (ref <200)
Cannabinoid Scrn, Ur: NOT DETECTED
Cocaine Metabolite Screen, Urine: NOT DETECTED (ref <300)
Methadone Screen, Urine: NOT DETECTED (ref <300)
Opiate Scrn, Ur: NOT DETECTED
PCP Screen, Urine: NOT DETECTED (ref <25)
Propoxyphene Scrn, Ur: NOT DETECTED (ref <300)

## 2017-08-21 LAB — TYPE AND SCREEN
ABO/Rh: O POS
Antibody Screen: NEGATIVE

## 2017-08-21 MED ORDER — CLINDAMYCIN PHOSPHATE IN D5W 900 MG/50ML IV SOLN
900 MG/50ML | INTRAVENOUS | Status: AC
Start: 2017-08-21 — End: 2017-08-21
  Administered 2017-08-21: 16:00:00 900 via INTRAVENOUS

## 2017-08-21 MED ORDER — IBUPROFEN 800 MG PO TABS
800 MG | Freq: Three times a day (TID) | ORAL | Status: DC | PRN
Start: 2017-08-21 — End: 2017-08-23
  Administered 2017-08-22 – 2017-08-23 (×3): 800 mg via ORAL

## 2017-08-21 MED ORDER — OXYCODONE-ACETAMINOPHEN 5-325 MG PO TABS
5-325 MG | ORAL | Status: AC | PRN
Start: 2017-08-21 — End: 2017-08-22
  Administered 2017-08-21 – 2017-08-22 (×3): 1 via ORAL

## 2017-08-21 MED ORDER — NALBUPHINE HCL 10 MG/ML IJ SOLN
10 MG/ML | INTRAMUSCULAR | Status: DC | PRN
Start: 2017-08-21 — End: 2017-08-21
  Administered 2017-08-21: 19:00:00 5 mg via INTRAVENOUS

## 2017-08-21 MED ORDER — DIPHENHYDRAMINE HCL 50 MG/ML IJ SOLN
50 MG/ML | Freq: Four times a day (QID) | INTRAMUSCULAR | Status: AC | PRN
Start: 2017-08-21 — End: 2017-08-22
  Administered 2017-08-22: 01:00:00 25 mg via INTRAVENOUS

## 2017-08-21 MED ORDER — NORMAL SALINE FLUSH 0.9 % IV SOLN
0.9 % | INTRAVENOUS | Status: DC | PRN
Start: 2017-08-21 — End: 2017-08-23

## 2017-08-21 MED ORDER — OXYCODONE-ACETAMINOPHEN 5-325 MG PO TABS
5-325 MG | ORAL | Status: DC | PRN
Start: 2017-08-21 — End: 2017-08-23
  Administered 2017-08-23: 02:00:00 2 via ORAL

## 2017-08-21 MED ORDER — CLINDAMYCIN PHOSPHATE IN D5W 900 MG/50ML IV SOLN
900 MG/50ML | INTRAVENOUS | Status: AC
Start: 2017-08-21 — End: 2017-08-21
  Administered 2017-08-21: 17:00:00 900 mg via INTRAVENOUS

## 2017-08-21 MED ORDER — OXYTOCIN 30 UNITS IN 500 ML INFUSION
30 UNIT/500ML | INTRAVENOUS | Status: DC
Start: 2017-08-21 — End: 2017-08-23
  Administered 2017-08-21: 17:00:00 999 m[IU]/min via INTRAVENOUS

## 2017-08-21 MED ORDER — PRENATAL 27-1 MG PO TABS
27-1 MG | Freq: Every day | ORAL | Status: DC
Start: 2017-08-21 — End: 2017-08-23

## 2017-08-21 MED ORDER — SOD CITRATE-CITRIC ACID 500-334 MG/5ML PO SOLN
500-334 MG/5ML | Freq: Once | ORAL | Status: AC
Start: 2017-08-21 — End: 2017-08-21
  Administered 2017-08-21: 17:00:00 30 mL via ORAL

## 2017-08-21 MED ORDER — FAMOTIDINE 20 MG/2ML IV SOLN
20 MG/2ML | Freq: Once | INTRAVENOUS | Status: AC
Start: 2017-08-21 — End: 2017-08-21
  Administered 2017-08-21: 12:00:00 20 mg via INTRAVENOUS

## 2017-08-21 MED ORDER — PHENYLEPHRINE HCL (PRESSORS) 10 MG/ML IJ SOLN
10 MG/ML | INTRAMUSCULAR | Status: DC | PRN
Start: 2017-08-21 — End: 2017-08-21
  Administered 2017-08-21: 17:00:00 100 via BUCCAL
  Administered 2017-08-21: 18:00:00 100 via INTRAVENOUS
  Administered 2017-08-21: 17:00:00 100 via BUCCAL

## 2017-08-21 MED ORDER — LANOLIN EX OINT
CUTANEOUS | Status: DC | PRN
Start: 2017-08-21 — End: 2017-08-23

## 2017-08-21 MED ORDER — OXYTOCIN 30 UNITS IN 500 ML INFUSION
30 UNIT/500ML | INTRAVENOUS | Status: DC
Start: 2017-08-21 — End: 2017-08-23

## 2017-08-21 MED ORDER — ONDANSETRON HCL 4 MG/2ML IJ SOLN
4 MG/2ML | Freq: Four times a day (QID) | INTRAMUSCULAR | Status: AC | PRN
Start: 2017-08-21 — End: 2017-08-22

## 2017-08-21 MED ORDER — DOCUSATE SODIUM 100 MG PO CAPS
100 MG | Freq: Two times a day (BID) | ORAL | Status: DC
Start: 2017-08-21 — End: 2017-08-23
  Administered 2017-08-21 – 2017-08-23 (×4): 100 mg via ORAL

## 2017-08-21 MED ORDER — NORMAL SALINE FLUSH 0.9 % IV SOLN
0.9 % | Freq: Two times a day (BID) | INTRAVENOUS | Status: DC
Start: 2017-08-21 — End: 2017-08-23

## 2017-08-21 MED ORDER — TETANUS-DIPHTH-ACELL PERTUSSIS 5-2.5-18.5 LF-MCG/0.5 IM SUSP
INTRAMUSCULAR | Status: AC
Start: 2017-08-21 — End: 2017-08-23
  Administered 2017-08-23: 16:00:00 0.5 mL via INTRAMUSCULAR

## 2017-08-21 MED ORDER — FERROUS SULFATE 325 (65 FE) MG PO TABS
325 (65 Fe) MG | Freq: Two times a day (BID) | ORAL | Status: DC
Start: 2017-08-21 — End: 2017-08-23

## 2017-08-21 MED ORDER — LACTATED RINGERS IV SOLN
INTRAVENOUS | Status: DC | PRN
Start: 2017-08-21 — End: 2017-08-21

## 2017-08-21 MED ORDER — BUPIVACAINE IN DEXTROSE 0.75-8.25 % IT SOLN
INTRATHECAL | Status: AC
Start: 2017-08-21 — End: 2017-08-21

## 2017-08-21 MED ORDER — ONDANSETRON HCL 4 MG/2ML IJ SOLN
4 MG/2ML | Freq: Four times a day (QID) | INTRAMUSCULAR | Status: DC | PRN
Start: 2017-08-21 — End: 2017-08-21

## 2017-08-21 MED ORDER — ACETAMINOPHEN 325 MG PO TABS
325 MG | ORAL | Status: DC | PRN
Start: 2017-08-21 — End: 2017-08-23

## 2017-08-21 MED ORDER — KETOROLAC TROMETHAMINE 30 MG/ML IJ SOLN
30 MG/ML | INTRAMUSCULAR | Status: AC
Start: 2017-08-21 — End: 2017-08-21
  Administered 2017-08-21: 19:00:00 15 via INTRAVENOUS

## 2017-08-21 MED ORDER — MORPHINE SULFATE (PF) 1 MG/ML IJ SOLN
1 | INTRAMUSCULAR | Status: AC
Start: 2017-08-21 — End: 2017-08-21

## 2017-08-21 MED ORDER — PROMETHAZINE HCL 25 MG/ML IJ SOLN
25 | INTRAMUSCULAR | Status: AC
Start: 2017-08-21 — End: 2017-08-21

## 2017-08-21 MED ORDER — FAMOTIDINE 20 MG/2ML IV SOLN
20 MG/2ML | INTRAVENOUS | Status: DC | PRN
Start: 2017-08-21 — End: 2017-08-21
  Administered 2017-08-21: 17:00:00 20 via INTRAVENOUS

## 2017-08-21 MED ORDER — OXYCODONE-ACETAMINOPHEN 5-325 MG PO TABS
5-325 MG | ORAL | Status: DC | PRN
Start: 2017-08-21 — End: 2017-08-23
  Administered 2017-08-23 (×2): 1 via ORAL

## 2017-08-21 MED ORDER — LACTATED RINGERS IV SOLN
INTRAVENOUS | Status: DC
Start: 2017-08-21 — End: 2017-08-23
  Administered 2017-08-21 (×5): via INTRAVENOUS

## 2017-08-21 MED ORDER — PROMETHAZINE HCL 25 MG/ML IJ SOLN
25 MG/ML | INTRAMUSCULAR | Status: DC | PRN
Start: 2017-08-21 — End: 2017-08-21
  Administered 2017-08-21: 18:00:00 10 via INTRAVENOUS
  Administered 2017-08-21: 18:00:00 5 via INTRAVENOUS
  Administered 2017-08-21: 18:00:00 10 via INTRAVENOUS

## 2017-08-21 MED ORDER — ONDANSETRON HCL 4 MG/2ML IJ SOLN
4 MG/2ML | INTRAMUSCULAR | Status: DC | PRN
Start: 2017-08-21 — End: 2017-08-21
  Administered 2017-08-21: 17:00:00 4 via INTRAVENOUS

## 2017-08-21 MED ORDER — KETOROLAC TROMETHAMINE 30 MG/ML IJ SOLN
30 MG/ML | Freq: Four times a day (QID) | INTRAMUSCULAR | Status: AC | PRN
Start: 2017-08-21 — End: 2017-08-22

## 2017-08-21 MED ORDER — ONDANSETRON HCL 4 MG/2ML IJ SOLN
4 | INTRAMUSCULAR | Status: AC
Start: 2017-08-21 — End: 2017-08-21

## 2017-08-21 MED ORDER — RHO D IMMUNE GLOBULIN 1500 UNITS IM SOSY
1500 units | Freq: Once | INTRAMUSCULAR | Status: DC
Start: 2017-08-21 — End: 2017-08-23

## 2017-08-21 MED ORDER — LACTATED RINGERS IV SOLN
INTRAVENOUS | Status: DC
Start: 2017-08-21 — End: 2017-08-23

## 2017-08-21 MED ORDER — BUPIVACAINE IN DEXTROSE 0.75-8.25 % IT SOLN
INTRATHECAL | Status: DC | PRN
Start: 2017-08-21 — End: 2017-08-21
  Administered 2017-08-21: 17:00:00 1.6

## 2017-08-21 MED ORDER — DEXTROSE 5 % IV SOLN
5 % | INTRAVENOUS | Status: AC
Start: 2017-08-21 — End: 2017-08-21
  Administered 2017-08-21: 17:00:00 400 mg via INTRAVENOUS

## 2017-08-21 MED ORDER — FAMOTIDINE 20 MG/2ML IV SOLN
20 | INTRAVENOUS | Status: AC
Start: 2017-08-21 — End: 2017-08-21

## 2017-08-21 MED ORDER — MORPHINE SULFATE (PF) 10 MG/ML IV SOLN
10 MG/ML | INTRAVENOUS | Status: DC | PRN
Start: 2017-08-21 — End: 2017-08-21
  Administered 2017-08-21: 17:00:00 .15 via INTRAVENOUS

## 2017-08-21 MED FILL — NALBUPHINE HCL 10 MG/ML IJ SOLN: 10 MG/ML | INTRAMUSCULAR | Qty: 1

## 2017-08-21 MED FILL — OXYCODONE-ACETAMINOPHEN 5-325 MG PO TABS: 5-325 MG | ORAL | Qty: 1

## 2017-08-21 MED FILL — DOCUSATE SODIUM 100 MG PO CAPS: 100 MG | ORAL | Qty: 1

## 2017-08-21 MED FILL — MORPHINE SULFATE (PF) 1 MG/ML IJ SOLN: 1 MG/ML | INTRAMUSCULAR | Qty: 10

## 2017-08-21 MED FILL — DIPHENHYDRAMINE HCL 50 MG/ML IJ SOLN: 50 MG/ML | INTRAMUSCULAR | Qty: 1

## 2017-08-21 MED FILL — KETOROLAC TROMETHAMINE 30 MG/ML IJ SOLN: 30 MG/ML | INTRAMUSCULAR | Qty: 1

## 2017-08-21 MED FILL — MARCAINE SPINAL 0.75-8.25 % IT SOLN: INTRATHECAL | Qty: 2

## 2017-08-21 MED FILL — OXYTOCIN 30 UNITS IN 500 ML INFUSION: 30 UNIT/500ML | INTRAVENOUS | Qty: 500

## 2017-08-21 MED FILL — FAMOTIDINE 20 MG/2ML IV SOLN: 20 MG/2ML | INTRAVENOUS | Qty: 2

## 2017-08-21 MED FILL — ONDANSETRON HCL 4 MG/2ML IJ SOLN: 4 MG/2ML | INTRAMUSCULAR | Qty: 2

## 2017-08-21 MED FILL — SOD CITRATE-CITRIC ACID 500-334 MG/5ML PO SOLN: 500-334 MG/5ML | ORAL | Qty: 30

## 2017-08-21 MED FILL — CLINDAMYCIN PHOSPHATE IN D5W 900 MG/50ML IV SOLN: 900 MG/50ML | INTRAVENOUS | Qty: 50

## 2017-08-21 MED FILL — GENTAMICIN SULFATE 40 MG/ML IJ SOLN: 40 MG/ML | INTRAMUSCULAR | Qty: 10

## 2017-08-21 MED FILL — PROMETHAZINE HCL 25 MG/ML IJ SOLN: 25 MG/ML | INTRAMUSCULAR | Qty: 1

## 2017-08-21 NOTE — Progress Notes (Signed)
Dr Allena KatzPatel notified of delay due to urgent case

## 2017-08-21 NOTE — Progress Notes (Signed)
Dr. Allena KatzPatel called and notified that Dr. Nila NephewEck called and states that the patient can not be c-sectioned until 12pm due to her consumption of milk at 0600. Blood pressure reviewed with Dr. Allena KatzPatel of 133/79 for day shift. Dr. Allena KatzPatel in agree ance with 12 pm c-section,.

## 2017-08-21 NOTE — Anesthesia Pre-Procedure Evaluation (Signed)
Department of Anesthesiology  Preprocedure Note       Name:  Tara Mccormick   Age:  26 y.o.  DOB:  05/27/1991                                          MRN:  [id number]         Date:  08/21/2017      Surgeon: Moishe Spice):  Alesia Morin, MD    Procedure: Procedure(s):  CESAREAN SECTION    Medications prior to admission:   Prior to Admission medications    Medication Sig Start Date End Date Taking? Authorizing Provider   acetaminophen (TYLENOL) 325 MG tablet Take 650 mg by mouth as needed for Pain   Yes Historical Provider, MD   Prenatal Vit-Fe Fumarate-FA (PRENATAL 1+1 PO) Take 1 capsule by mouth daily   Yes Historical Provider, MD       Current medications:    Current Facility-Administered Medications   Medication Dose Route Frequency Provider Last Rate Last Dose   ??? lactated ringers infusion   Intravenous Continuous Alesia Morin, MD 125 mL/hr at 08/21/17 1478     ??? citric acid-sodium citrate (BICITRA) solution 30 mL  30 mL Oral Once Alesia Morin, MD       ??? gentamicin (GARAMYCIN) 400 mg in dextrose 5 % 250 mL IVPB  400 mg Intravenous On Call to OR Alesia Morin, MD       ??? clindamycin (CLEOCIN) 900 mg in dextrose 5 % 50 mL IVPB  900 mg Intravenous On Call to OR Alesia Morin, MD       ??? oxytocin (PITOCIN) 30 units in 500 mL infusion  1 milli-units/min Intravenous Continuous Alesia Morin, MD           Allergies:    Allergies   Allergen Reactions   ??? Penicillins Nausea And Vomiting and Rash     vomiting       Problem List:    Patient Active Problem List   Diagnosis Code   ??? [redacted] weeks gestation of pregnancy Z3A.29   ??? Pregnant and not yet delivered in third trimester Z34.93   ??? Single delivery by C-section O82       Past Medical History:        Diagnosis Date   ??? Mental disorder     anxiety       Past Surgical History:        Procedure Laterality Date   ??? CESAREAN SECTION, LOW TRANSVERSE     ??? OTHER SURGICAL HISTORY  26yrs old and 26 yrs old    tubes in ears       Social History:    Social History   Substance Use Topics   ??? Smoking  status: Current Some Day Smoker     Packs/day: 0.50     Types: Cigarettes     Start date: 06/27/2009   ??? Smokeless tobacco: Never Used   ??? Alcohol use No                                Ready to quit: Not Answered  Counseling given: Not Answered      Vital Signs (Current):   Vitals:    08/21/17 0604 08/21/17 0618 08/21/17 0705 08/21/17 0833   BP: (!) 143/104 (!) 137/90 133/79 122/78  Pulse: 85 75 73 78   Resp: Temp:    36.9 ??C (98.4 ??F)   TempSrc:    Oral   Weight:    172 lb (78 kg)   Height:     (1.676 m)                                              BP Readings from Last 3 Encounters:   08/21/17 122/78   06/27/17 118/66       NPO Status: Time of last liquid consumption: 0600                        Time of last solid consumption: 0100                        Date of last liquid consumption: 08/21/17                        Date of last solid food consumption: 08/21/17    BMI:   Wt Readings from Last 3 Encounters:   08/21/17 172 lb (78 kg)   06/27/17 163 lb (73.9 kg)     Body mass index is 27.76 kg/m??.    CBC:   Lab Results   Component Value Date    WBC 10.4 08/21/2017    RBC 3.96 08/21/2017    HGB 11.7 08/21/2017    HCT 35.6 08/21/2017    MCV 89.9 08/21/2017    RDW 14.2 08/21/2017    PLT 299 08/21/2017       CMP:   Lab Results   Component Value Date    NA 137 08/21/2017    K 4.1 08/21/2017    CL 102 08/21/2017    CO2 23 08/21/2017    BUN 7 08/21/2017    CREATININE 0.5 08/21/2017    GFRAA >60 08/21/2017    LABGLOM >60 08/21/2017    GLUCOSE 107 08/21/2017    PROT 7.2 08/21/2017    CALCIUM 9.2 08/21/2017    BILITOT 0.5 08/21/2017    ALKPHOS 261 08/21/2017    AST 33 08/21/2017    ALT 40 08/21/2017       POC Tests: No results for input(s): POCGLU, POCNA, POCK, POCCL, POCBUN, POCHEMO, POCHCT in the last 72 hours.    Coags: No results found for: PROTIME, INR, APTT    HCG (If Applicable): No results found for: PREGTESTUR, PREGSERUM, HCG, HCGQUANT     ABGs: No results found for: PHART, PO2ART, PCO2ART,  HCO3ART, BEART, O2SATART     Type & Screen (If Applicable):  No results found for: LABABO, LABRH    Anesthesia Evaluation  Patient summary reviewed and Nursing notes reviewed no history of anesthetic complications:   Airway: Mallampati: II  TM distance: >3 FB   Neck ROM: full  Mouth opening: > = 3 FB Dental: normal exam     Comment: Pt denies any chipped, loose, missing teeth.      Pulmonary:   (+) current smoker          Patient smoked on day of surgery.                 Cardiovascular:Negative CV ROS            Rhythm:  regular  Rate: normal           Beta Blocker:  Not on Beta Blocker      ROS comment: Pt denies any cardiovascular problems      Neuro/Psych:   (+) headaches:, psychiatric history:depression/anxiety             GI/Hepatic/Renal:   (+) GERD:,           Endo/Other: Negative Endo/Other ROS                    Abdominal:           Vascular: negative vascular ROS.                                     Anesthesia Plan      spinal     ASA 3             Anesthetic plan and risks discussed with patient.    Use of blood products discussed with patient whom consented to blood products.   Plan discussed with CRNA and attending.                Hale Bogusonald Emelda Kohlbeck, RN   08/21/2017

## 2017-08-21 NOTE — Progress Notes (Signed)
Spoke with Debbie at Dr Eliane DecreePatel's office - GBS was positive; susceptibility results not yet available.

## 2017-08-21 NOTE — Progress Notes (Signed)
ETCO2 attempted and explained to patient - patient refusing.

## 2017-08-21 NOTE — Progress Notes (Signed)
Assessment as charted. Denies any calf pain or tenderness. Fundus firm -2, small amount of lochia, rubra- one small golf ball sized clot- broke apart easily. Foley removed at this time, emptied for 1200 mL clear yellow urine. DTV 0500-0700. Instructed patient to call for first time out of bed. Denies the need for pain medication at this time. No current complaints.

## 2017-08-21 NOTE — Progress Notes (Signed)
Patient dangled at the edge of the bed, patient then stood and ambulated for a few moments. Peri-care done, small amount of lochia, rubra- no clots. Patient also medicated at this time for incisional discomfort. No other complaints, instructed to call for any needs.

## 2017-08-21 NOTE — Op Note (Signed)
Cesarean Section Operative Note     PREOPERATIVE DIAGNOSES:   1. 771w1d week intrauterine pregnancy.  2. Previous c/s  3.  Gestational HTN    POSTOPERATIVE DIAGNOSES:   Same.    PROCEDURE: Repeat low transverse cesarean section.    SURGEON:Taci Sterling Allena KatzPatel, MD    ASSISTANTS: Linus GalasJ St Julien. MD    ESTIMATED BLOOD LOSS:64400mL    COMPLICATIONS: None.     ANESTHESIA:spinal    FINDINGS:FINDINGS: Live Born  Sex:  female  Fetal Position:  cephalic  Apgars:  8, 9  Weight:  2820gms  Tubes, uterus, ovaries:  normal    INDICATION/CONSENT: This is a 26 yo G4P1021 at 7571w1d who presented for elevated BP's at home. Labs were wnl except for slightly elevated LFTs which may be related to pts daily morning N/V. BP's were normal to mild range. Because of mild range BP's, decision was made to proceed to the OR for repeat c/s. Risks/benefits/alternatives were discussed with patient including risk of bleeding requiring transfusion, infection, injury to bowel/urinary tract, anesthesia, postop thromboembolism and cardiorespiratory complications.     DETAILS OF PROCEDURE:The patient was taken to the operating room. After satisfactory anesthesia was obtained, the patient was placed in the dorsal supine position with a leftward tilt, she was prepped and draped in usual sterile fashion. A time out was performed to identify patient and surgery being performed. A Pfannenstiel skin incision was made using a scalpel and carried down to the fascia using the bovie. Bovie cautery was used to control hemostasis where needed. The fascia was then incised with the scalpel and extended laterally with mayo scissors. Kocher clamps were then used to grasp the fascia both superiorly and inferiorly and dissected free from underlying rectus muscles using blunt dissection and  Bovie cautery. The rectus muscles were separated in the midline and the peritoneum was identified and entered sharply. The incision was extended superiorly and  inferiorly with good visualization of the bladder. The bladder blade was inserted. A bladder flap was created in the lower uterine segment using Metzenbaum scissors and blunt dissection. The bladder blade was repositioned to protect the bladder. A transverse incision was made in the lower uterine segment with the scalpel and extended laterally with blunt dissection. Rupture of membranes for clear fluid was performed and a female  was delivered from [facility]. The cord was clamped and baby was handed to the awaiting attendants. Apgar's and weight are found above. Cord blood and gases were obtained. The placenta was manually expressed and uterus exteriorized. The uterus was cleared of any residual tissue and clots using a clean sponge. The uterine incision was then closed with 0-Vicryl in a running locked fashion. A second imbricating layer was then performed for good hemostasis. Once hemostasis was assured the uterus was returned to the peritoneal cavity and gutters were cleared of clots and debris. The uterus was reexamined in anatomic position and found to be hemostatic. The fascia was then closed with 1-stratifix in a running fashion. Irrigation was performed and hemostasis was assured. The skin was then closed with 4-0 stratifix. All sponge and needle counts were correct. Mother and baby are being transferred to the recovery room. There were no complications.     Tara MorinPriya Zabian Swayne, MD  OBGYN

## 2017-08-21 NOTE — Anesthesia Procedure Notes (Signed)
Spinal Block    Patient location during procedure: OB  Reason for block: primary anesthetic and at surgeon's request  Staffing  Anesthesiologist: ECK, HEATHER C  Resident/CRNA: Ayesha RumpfPELANDA, Elexius Minar L  Other anesthesia staff: Rober MinionHUNKUS, RONALD  Performed: other anesthesia staff   Preanesthetic Checklist  Completed: patient identified, site marked, surgical consent, pre-op evaluation, timeout performed, IV checked, risks and benefits discussed, monitors and equipment checked, anesthesia consent given, oxygen available and patient being monitored  Spinal Block  Patient position: sitting  Prep: ChloraPrep  Patient monitoring: cardiac monitor, continuous pulse ox and frequent blood pressure checks  Approach: midline  Location: L3/L4  Provider prep: mask and sterile gloves  Local infiltration: lidocaine  Agent: bupivacaine  Adjuvant: duramorph (.15)  Dose: 1.6  Dose: 1.6  Needle  Needle type: Sprotte Tip   Needle gauge: 25 G  Needle length: 4 in  Assessment  Swirl obtained: Yes  CSF: clear  Attempts: 1  Hemodynamics: stable

## 2017-08-21 NOTE — Progress Notes (Signed)
PreOp DX:  37w1 Pregnancy    PPIH  Previous Cesarean      Post OP Dx:  2033w1d Pregnancy delivered by Cesarean                       Living female baby delivered      Procedure: Cesarean Surgery/ Assistance    Anesthesia: Spinal      Under Spinal anesthesia the abdomen was prepared and draped .    With my technical assistance Dr Leodis BinetP. Patel performed  a Cesarean, opening the abdomen, incising the uterus and delivering a living female baby at 1328, weighing 2820 gms with Apgar: 8/9    Then the uterus and the abdomen were closed.    Procedure was well tolerated.

## 2017-08-21 NOTE — Progress Notes (Signed)
Spoke with Dr Allena KatzPatel patient will be a c-section today

## 2017-08-21 NOTE — Progress Notes (Signed)
Peri care done, pt tolerated well

## 2017-08-21 NOTE — Progress Notes (Signed)
Paged Dr Allena KatzPatel to give results of lab work

## 2017-08-21 NOTE — Progress Notes (Signed)
[facility] and [facility] Bedside information reviewed w/ pt; pt allergies and birthdate verified. Pt states hungry and denies nausea; inst on gradual return to diet as well as dietary measures to assist w/ bowel resumption. Pt states will accept offered tdap vaccine. Pt inst on smoking cessation w/ resources given; pt ref smoking patch; pt states she wants to quit. FOB at bedside; pt vs w/ friends and family.

## 2017-08-21 NOTE — Progress Notes (Signed)
Patient came to L and D for increase BP, stated she checked her BP at 2200 with grandma cuff and it was 186/104 states she checked it multiple times after and stayed the same.  States she had high BP while in labor with her previous pregnancy 7 years ago. States she does feel her baby move, denies LOF, Denies contractions, denies vaginal bleeding.States she does not see a high risk doctor with this pregnancy.  Patient placed on monitor vitals taken.  House office notified.

## 2017-08-21 NOTE — Plan of Care (Signed)
Problem: Fluid Volume - Imbalance:  Goal: Absence of imbalanced fluid volume signs and symptoms  Absence of imbalanced fluid volume signs and symptoms   Outcome: Met This Shift      Problem: Nausea/Vomiting:  Goal: Absence of nausea/vomiting  Absence of nausea/vomiting   Outcome: Met This Shift

## 2017-08-21 NOTE — H&P (Signed)
Department of Obstetrics and Gynecology  Attending Obstetrics History and Physical        CHIEF COMPLAINT:  Elevated bp at home     HISTORY OF PRESENT ILLNESS:      The patient is a [age] y.o. female [id number], Patient's last menstrual period was 12/04/2016.,  at 7037w1d.  OB History     Gravida Para Term Preterm AB Living    4 1 1   2 1     SAB TAB Ectopic Molar Multiple Live Births    1         1      Patient presents with a chief complaint as above.    Estimated Due Date: Estimated Date of Delivery: 09/10/17    PRENATAL CARE:    Complicated by:   Patient Active Problem List   Diagnosis Code   . [redacted] weeks gestation of pregnancy Z3A.29       PAST OB HISTORY  OB History     Gravida Para Term Preterm AB Living    4 1 1   2 1     SAB TAB Ectopic Molar Multiple Live Births    1         1          Past Medical History:    History reviewed. No pertinent past medical history.  Past Surgical History:        Procedure Laterality Date   . CESAREAN SECTION, LOW TRANSVERSE       Social History:    TOBACCO:   reports that she has been smoking Cigarettes.  She started smoking about 8 years ago. She has been smoking about 0.50 packs per day. She has never used smokeless tobacco.  ETOH:   reports that she does not drink alcohol.  DRUGS:   reports that she does not use drugs.  Family History:   History reviewed. No pertinent family history.  Medications Prior to Admission:  Prescriptions Prior to Admission: acetaminophen (TYLENOL) 325 MG tablet, Take 650 mg by mouth as needed for Pain  Prenatal Vit-Fe Fumarate-FA (PRENATAL 1+1 PO), Take 1 capsule by mouth daily  Allergies:  Penicillins    Review of Systems:   Ears, nose, mouth, throat, and face: negative  Respiratory: negative  Cardiovascular: negative  Gastrointestinal: negative  Genitourinary:negative  Integument/breast: negative  Hematologic/lymphatic: negative  Musculoskeletal:negative  Neurological: negative  Behavioral/Psych: negative  Endocrine: negative  Allergic/Immunologic:  negative    PHYSICAL EXAM:    General appearance:  awake, alert, cooperative, no apparent distress, and appears stated age  Neurologic:  Awake, alert, oriented to name, place and time.  Cranial nerves II-XII are grossly intact.  Motor is 5 out of 5 bilaterally.  Cerebellar finger to nose, heel to shin intact.  Sensory is intact.  Babinski down going, Romberg negative, and gait is normal.  Lungs:  No increased work of breathing, good air exchange, clear to auscultation bilaterally, no crackles or wheezing  Heart:  Normal apical impulse, regular rate and rhythm, normal S1 and S2, no S3 or S4, and no murmur noted  Abdomen:  No scars, normal bowel sounds, soft, non-distended, non-tender, no masses palpated, no hepatosplenomegally  Fetal heart rate:  Baseline Heart Rate 140, accelerations:  present, decelerations:  absent  Pelvis:  External Genitalia: General appearance; normal, Hair distribution; normal, Lesions absent  Cervix:    DILATION:  Cl cm  EFFACEMENT:   80%  STATION:  0cm      Contraction frequency:  0minutes  Membranes:  intact    BP 136/86   Pulse 86   Temp 97.9 F (36.6 C) (Oral)   Resp 16   Ht  (1.676 m)   Wt 172 lb (78 kg)   LMP 12/04/2016   BMI 27.76 kg/m     General Labs:    CBC: No results found for: WBC, RBC, HGB, HCT, MCV, RDW, PLT  CMP:  No results found for: NA, K, CL, CO2, BUN, PROT, ALB  U/A:  No components found for: UCOLOR, UCLARITY, USPGRAV, UPH, UPROTEIN, UGLUCOSE, UKETONE, UBILI, UBLOOD, UNITRITE, UUROBIL, ULEUKEST, USQEPI, [place], UWBC, [place], [place], UHYALINE    ASSESSMENT AND PLAN:  IUP at 37 weeks  R/o pih  Dr. Francoise Schaumann for Allena Katz notified = ua/cbc/cmp      Electronically signed by Galen Daft, MD on 08/21/2017 at 1:17 AM

## 2017-08-21 NOTE — Progress Notes (Signed)
Spoke with Dr Francoise SchaumannBaird gave update on FHT, contraction pattern, lab results, new order to draw CMP in am

## 2017-08-22 LAB — COMPREHENSIVE METABOLIC PANEL
ALT: 41 U/L — ABNORMAL HIGH (ref 0–32)
AST: 34 U/L — ABNORMAL HIGH (ref 0–31)
Albumin: 3.2 g/dL — ABNORMAL LOW (ref 3.5–5.2)
Alkaline Phosphatase: 239 U/L — ABNORMAL HIGH (ref 35–104)
Anion Gap: 10 mmol/L (ref 7–16)
BUN: 5 mg/dL — ABNORMAL LOW (ref 6–20)
CO2: 25 mmol/L (ref 22–29)
Calcium: 9.2 mg/dL (ref 8.6–10.2)
Chloride: 100 mmol/L (ref 98–107)
Creatinine: 0.6 mg/dL (ref 0.5–1.0)
GFR African American: >60
GFR Non-African American: >60 mL/min/{1.73_m2} (ref >=60)
Glucose: 88 mg/dL (ref 74–109)
Potassium: 4.4 mmol/L (ref 3.5–5.0)
Sodium: 135 mmol/L (ref 132–146)
Total Bilirubin: 0.5 mg/dL (ref 0.0–1.2)
Total Protein: 6.4 g/dL (ref 6.4–8.3)

## 2017-08-22 LAB — CBC
Hematocrit: 31.4 % — ABNORMAL LOW (ref 34.0–48.0)
Hemoglobin: 10.1 g/dL — ABNORMAL LOW (ref 11.5–15.5)
MCH: 29.5 pg (ref 26.0–35.0)
MCHC: 32.2 % (ref 32.0–34.5)
MCV: 91.8 fL (ref 80.0–99.9)
MPV: 11 fL (ref 7.0–12.0)
Platelets: 270 E9/L (ref 130–450)
RBC: 3.42 E12/L — ABNORMAL LOW (ref 3.50–5.50)
RDW: 14.3 fL (ref 11.5–15.0)
WBC: 10.7 E9/L (ref 4.5–11.5)

## 2017-08-22 MED FILL — OXYCODONE-ACETAMINOPHEN 5-325 MG PO TABS: 5-325 MG | ORAL | Qty: 1

## 2017-08-22 MED FILL — DOCUSATE SODIUM 100 MG PO CAPS: 100 MG | ORAL | Qty: 1

## 2017-08-22 MED FILL — DIPHENHYDRAMINE HCL 50 MG/ML IJ SOLN: 50 MG/ML | INTRAMUSCULAR | Qty: 1

## 2017-08-22 MED FILL — NORMAL SALINE FLUSH 0.9 % IV SOLN: 0.9 % | INTRAVENOUS | Qty: 10

## 2017-08-22 MED FILL — KETOROLAC TROMETHAMINE 30 MG/ML IJ SOLN: 30 MG/ML | INTRAMUSCULAR | Qty: 1

## 2017-08-22 NOTE — Progress Notes (Signed)
Subjective:     Postpartum Day 1: Cesarean Delivery    The patient feels well.  Pain is moderately controlled with current medications. Baby is feeding via bottle. Urinary output is adequate. The patient is ambulating well. The patient is tolerating a normal diet. Flatus has been passed.    Objective:        Vitals:    08/22/17 0722   BP: (!) 112/58   Pulse: 85   Resp: 17   Temp: 98 F (36.7 C)   SpO2:          Intake/Output Summary (Last 24 hours) at 08/22/17 [id number]  Last data filed at 08/21/17 2258   Gross per 24 hour   Intake          2890.92 ml   Output             3900 ml   Net         -1009.08 ml     Lab Results   Component Value Date    WBC 10.7 08/22/2017    HGB 10.1 (L) 08/22/2017    HCT 31.4 (L) 08/22/2017    MCV 91.8 08/22/2017    PLT 270 08/22/2017       General:    alert, appears stated age and cooperative       Lochia:  appropriate   Uterine    firm   Incision:  healing well   DVT Evaluation:  No evidence of DVT seen on physical exam.     Assessment:     Status post Cesarean section. Doing well postoperatively.     Plan:     Continue current care.

## 2017-08-22 NOTE — Anesthesia Post-Procedure Evaluation (Signed)
Department of Anesthesiology  Postprocedure Note    Patient: Tara Mccormick  MRN: [id number]  Birthdate: 1991/09/28  Date of evaluation: 08/22/2017  Time:  10:55 AM     Procedure Summary     Date:  08/21/17 Room / Location:  [facility] OR 02 / [facility]    Anesthesia Start:  1302 Anesthesia Stop:  1424    Procedure:  CESAREAN SECTION (N/A Uterus) Diagnosis:  (Dr Allena KatzPatel)    Surgeon:  Alesia MorinPriya Patel, MD Responsible Provider:  Aida RaiderHeather C Eck, MD    Anesthesia Type:  spinal ASA Status:  3          Anesthesia Type: spinal    Aldrete Phase I: Aldrete Score: 10    Aldrete Phase II:      Last vitals: Reviewed and per EMR flowsheets.       Anesthesia Post Evaluation    Patient location during evaluation: bedside  Patient participation: complete - patient participated  Level of consciousness: awake and alert  Airway patency: patent  Nausea & Vomiting: no nausea and no vomiting  Complications: no  Cardiovascular status: hemodynamically stable  Respiratory status: acceptable and room air  Hydration status: stable

## 2017-08-22 NOTE — Progress Notes (Signed)
Patient up to the restroom and voided a sufficient amount at this time. Instructed on peri-care. Returned to bed.

## 2017-08-22 NOTE — Progress Notes (Signed)
Pt resting in bed; states taking general diet w/o nausea, passing gas and pain meds effective.

## 2017-08-23 ENCOUNTER — Other Ambulatory Visit: Payer: BLUE CROSS/BLUE SHIELD

## 2017-08-23 DIAGNOSIS — R7309 Other abnormal glucose: Secondary | ICD-10-CM

## 2017-08-23 MED ORDER — DSS 100 MG PO CAPS
100 MG | Freq: Two times a day (BID) | ORAL | Status: DC
Start: 2017-08-23 — End: 2021-10-30

## 2017-08-23 MED ORDER — SIMETHICONE 80 MG PO CHEW
80 MG | ORAL_TABLET | Freq: Four times a day (QID) | ORAL | 3 refills | Status: DC | PRN
Start: 2017-08-23 — End: 2021-10-30

## 2017-08-23 MED ORDER — SIMETHICONE 80 MG PO CHEW
80 MG | Freq: Four times a day (QID) | ORAL | Status: DC | PRN
Start: 2017-08-23 — End: 2017-08-23
  Administered 2017-08-23 (×2): 80 mg via ORAL

## 2017-08-23 MED ORDER — IBUPROFEN 800 MG PO TABS
800 MG | ORAL_TABLET | Freq: Three times a day (TID) | ORAL | 3 refills | Status: DC | PRN
Start: 2017-08-23 — End: 2021-10-30

## 2017-08-23 MED ORDER — OXYCODONE-ACETAMINOPHEN 5-325 MG PO TABS
5-325 MG | ORAL_TABLET | ORAL | 0 refills | Status: AC | PRN
Start: 2017-08-23 — End: 2017-08-30

## 2017-08-23 MED FILL — OXYCODONE-ACETAMINOPHEN 5-325 MG PO TABS: 5-325 MG | ORAL | Qty: 1

## 2017-08-23 MED FILL — DOCUSATE SODIUM 100 MG PO CAPS: 100 MG | ORAL | Qty: 1

## 2017-08-23 MED FILL — OXYCODONE-ACETAMINOPHEN 5-325 MG PO TABS: 5-325 MG | ORAL | Qty: 2

## 2017-08-23 MED FILL — BOOSTRIX 5-2.5-18.5 IM SUSP: INTRAMUSCULAR | Qty: 0.5

## 2017-08-23 MED FILL — IBUPROFEN 800 MG PO TABS: 800 MG | ORAL | Qty: 1

## 2017-08-23 MED FILL — SIMETHICONE 80 MG PO CHEW: 80 MG | ORAL | Qty: 1

## 2017-08-23 NOTE — Progress Notes (Signed)
Pt up and about; states she feels good and wants to go home today; states she  is taking general diet w/o nausea, passing gas ( but feels BM "soon") and pain meds effective.

## 2017-08-23 NOTE — Progress Notes (Signed)
Pt states ready for discharge; pt discharged in apparently stable condition to home w/ baby.

## 2017-08-23 NOTE — Discharge Instructions (Signed)
Follow-up with your OB doctor in 1 week  c-section delivery unless otherwise instructed.   Call office for an appointment.      DIET   Eat a well balanced diet focusing on foods high in fiber and protein   Drink plenty of fluids especially water.   To avoid constipation you may take a mild stool softener as recommended by your doctor or midwife.    ACTIVITY   Gradually increase your activity.  Resume exercise regimen only after advised by your doctor or midwife.   Avoid lifting anything heavier than your baby or a gallon of milk for SIX weeks.    Avoid driving until your doctor or midwife has given their approval.   Rise slowly from a lying to sitting and then a standing position.   Climb stairs one at a time.  Use caution when carrying your baby up and down the stairs.   No sexual activity for 6 weeks or until advised by your doctor - Nothing in vagina: intercourse, tampons, or douching.    Be prepared to discuss family planning at your follow-up OB visit.    You may feel tired or have a lack of energy.  You may continue your prenatal vitamin to replenish nutrients post delivery.  Nap when baby naps to catch up on sleep.   May return to work or school in 6 weeks or as directed by OB.     EMOTIONS   You may feed moody, sad, teary, & overwhelmed.  Contact your OB provider if you feel you may be showing signs of postpartum depression, or have thoughts of harming yourself or your infant.   If infant will not stop crying, contact another adult for help or place infant in their crib on their back and take a break.  NEVER shake your infant.      BLEEDING   Vaginal bleeding will decrease in amount over the next few weeks.   You will notice that as your activity increases, your flow may increase.  This is your body's way of telling you, you need to take things easier and rest more often.   Call your OB/ER if you are saturating more than one maxi pad in an hour.    BREAST CARE                               For non-breastfeeding moms:           If you develop a warm, red, tender area on your breast or develop a fever contact your OB provider.   You may apply ice packs to your breasts over your bra for twenty minutes at a time for comfort.   Avoid stimulation to your breasts, when showering allow the water to strike your back not your breasts.     Wear a good fitting bra until your milk dries, such as a sports bra.    INCISIONAL CARE / PERI CARE     Clean your incision in the shower with mild soap.  After shower pat the incision area dry and leave open to air.   Use the peri-bottle after toileting until bleeding stops.   Cleanse your perineum from front to back   If used, stitches or internal clips will dissolve in 4-6 weeks.   Kegel exercises will help restore bladder control.     SWELLING   Keep your legs elevated when sitting or lying.      When wearing stocking or socks, make sure they are not too tight.    WHEN TO CALL THE DOCTOR   If you have a temp of 100.4 or more.    If your bleeding has increased and you are saturating a pad in an hour.   Your abdomen is tender to touch.   You are passing blood clots bigger than the size of a lemon.   If you are experiencing extreme weakness or dizziness.    If you are having flu-like symptoms such as achy muscles or joints.   There is a foul smell or a green color to your vaginal bleeding.   If you have pain that cannot be relieved.   You have persistent burning with urination or frequency.    Call if you have concerns about your well-being.   You are unable to sleep, eat, or are having thoughts of harming yourself or your baby.    You have swelling, bleeding, drainage, foul odor, redness, or warmth in/around your incision or stitches.   You have a red, warm, tender area in your calf.      Never Shake a Baby Promise    Shaking can kill a baby.  It can also cause seizures, brain damage, learning problems, cerebral palsy, blindness and other serious  health and developmental problems.  I understand that shaking a baby is a serious form of child abuse.      I Promise Never To Shake My Baby    I understand that caregivers other then the mother often shake babies.  I also promise to discuss the dangers of shaking a baby with everyone who takes care of my baby.  I promise to tell anyone who cares for my baby to never, never shake my baby.

## 2017-08-23 NOTE — Progress Notes (Signed)
Subjective:     Postpartum Day 2: Cesarean Delivery    The patient feels well.  Pain is well controlled with current medications.  The patient is ambulating well. The patient is tolerating a normal diet. Flatus denies been passed.    Objective:        Vitals:    08/23/17 0800   BP: 129/63   Pulse: 86   Resp: 18   Temp: 97.7 ??F (36.5 ??C)   SpO2:        No intake or output data in the 24 hours ending 08/23/17 1125  Lab Results   Component Value Date    WBC 10.7 08/22/2017    HGB 10.1 (L) 08/22/2017    HCT 31.4 (L) 08/22/2017    MCV 91.8 08/22/2017    PLT 270 08/22/2017       General:    alert, appears stated age and cooperative           Uterine    firm   Incision:  dressing dry   DVT Evaluation:  No evidence of DVT seen on physical exam.     Assessment:     Status post Cesarean section. Doing well postoperatively.     Plan:     Discharge home with standard precautions and return to office in 1 weeks      Electronically signed by Nichola SizerJennifer M Calhoun Reichardt, MD on 08/23/2017 at 11:25 AM

## 2017-08-23 NOTE — Discharge Summary (Signed)
Obstetrical Discharge Form    Gestational Age:7740w1d    Antepartum complications: gestational HTN    Date of Delivery: 08/21/2017     Type of Delivery: c-section for repeat, gestational HTN    Delivered By:  Leodis BinetP Aliah Eriksson, MD         Baby:   Information for the patient's newborn:  Audrea MuscatWillis, Baby Girl Aarini [[id number]][[id number]]        Anesthesia: Spinal    Intrapartum complications: none      Postpartum complications: none    Discharge Date: 08/23/2017    Plan:   Follow up in 1 week(s)

## 2017-08-23 NOTE — Progress Notes (Signed)
Discharge instructions reviewed w/ pt w/ understanding verbalized; prescriptions for motrin and percocet handed to pt; pt denies any questions.

## 2017-08-23 NOTE — Plan of Care (Signed)
Problem: Fluid Volume - Imbalance:  Goal: Absence of postpartum hemorrhage signs and symptoms  Absence of postpartum hemorrhage signs and symptoms   Outcome: Met This Shift      Problem: Infection - Surgical Site:  Goal: Will show no infection signs and symptoms  Will show no infection signs and symptoms   Outcome: Met This Shift      Problem: Pain - Acute:  Goal: Pain level will decrease  Pain level will decrease   Outcome: Met This Shift

## 2017-08-24 LAB — GESTATIONAL GLUCOSE TOLERANCE
GLUCOSE 3 HOUR GTT: 136 mg/dL (ref 65–139)
Glucose, Fasting: 76 mg/dL (ref 65–94)
Glucose, GTT - 1 Hour: 107 mg/dL (ref 65–179)
Glucose, GTT - 2 Hour: 152 mg/dL (ref 65–154)

## 2017-08-30 ENCOUNTER — Ambulatory Visit (INDEPENDENT_AMBULATORY_CARE_PROVIDER_SITE_OTHER): Payer: BLUE CROSS/BLUE SHIELD | Admitting: Obstetrics and Gynecology

## 2017-08-30 VITALS — BP 102/55 | HR 87 | Wt 162.8 lb

## 2017-08-30 DIAGNOSIS — Z3493 Encounter for supervision of normal pregnancy, unspecified, third trimester: Secondary | ICD-10-CM

## 2017-08-30 LAB — POCT URINALYSIS DIPSTICK
Bilirubin, UA: NEGATIVE
GLUCOSE UA: NEGATIVE
Ketones, UA: 5
NITRITE UA: NEGATIVE
RBC UA: NEGATIVE
Spec Grav, UA: 1.01 (ref 1.010–1.025)
UROBILINOGEN UA: 0.2 U/dL
pH, UA: 7.5 (ref 5.0–8.0)

## 2017-08-30 NOTE — Progress Notes (Signed)
ROB- pt is doing well, denies any complaints 

## 2017-08-30 NOTE — Progress Notes (Signed)
ROB-doing well, cord around next on 4D pictures. Discussed labs.

## 2017-09-15 ENCOUNTER — Ambulatory Visit (INDEPENDENT_AMBULATORY_CARE_PROVIDER_SITE_OTHER): Payer: BLUE CROSS/BLUE SHIELD | Admitting: Obstetrics and Gynecology

## 2017-09-15 VITALS — BP 118/62 | HR 104 | Wt 164.0 lb

## 2017-09-15 DIAGNOSIS — Z23 Encounter for immunization: Secondary | ICD-10-CM | POA: Diagnosis not present

## 2017-09-15 DIAGNOSIS — Z3493 Encounter for supervision of normal pregnancy, unspecified, third trimester: Secondary | ICD-10-CM

## 2017-09-15 LAB — POCT URINALYSIS DIPSTICK
Bilirubin, UA: NEGATIVE
Glucose, UA: NEGATIVE
Ketones, UA: 5
Leukocytes, UA: NEGATIVE
Nitrite, UA: NEGATIVE
PH UA: 6.5 (ref 5.0–8.0)
PROTEIN UA: NEGATIVE
RBC UA: NEGATIVE
SPEC GRAV UA: 1.01 (ref 1.010–1.025)
UROBILINOGEN UA: 0.2 U/dL

## 2017-09-15 NOTE — Progress Notes (Signed)
ROB- pt is doing well, flu vaccine given 

## 2017-09-15 NOTE — Progress Notes (Signed)
ROB-doing well, occasional round ligament pain, already enrolled in classes.

## 2017-09-27 ENCOUNTER — Ambulatory Visit (INDEPENDENT_AMBULATORY_CARE_PROVIDER_SITE_OTHER): Payer: BLUE CROSS/BLUE SHIELD | Admitting: Obstetrics and Gynecology

## 2017-09-27 VITALS — BP 108/68 | HR 70 | Wt 168.1 lb

## 2017-09-27 DIAGNOSIS — Z3493 Encounter for supervision of normal pregnancy, unspecified, third trimester: Secondary | ICD-10-CM

## 2017-09-27 LAB — POCT URINALYSIS DIPSTICK
Bilirubin, UA: NEGATIVE
Glucose, UA: NEGATIVE
Ketones, UA: 5
NITRITE UA: NEGATIVE
PH UA: 7 (ref 5.0–8.0)
PROTEIN UA: NEGATIVE
RBC UA: NEGATIVE
Spec Grav, UA: 1.01 (ref 1.010–1.025)
UROBILINOGEN UA: 0.2 U/dL

## 2017-09-27 NOTE — Progress Notes (Signed)
ROB-still having back pains, some swelling in feet,

## 2017-09-27 NOTE — Progress Notes (Signed)
ROB- pt is doing well, denies any complaints 

## 2017-10-13 ENCOUNTER — Ambulatory Visit (INDEPENDENT_AMBULATORY_CARE_PROVIDER_SITE_OTHER): Payer: BLUE CROSS/BLUE SHIELD | Admitting: Obstetrics and Gynecology

## 2017-10-13 VITALS — BP 112/62 | HR 87 | Wt 171.1 lb

## 2017-10-13 DIAGNOSIS — Z113 Encounter for screening for infections with a predominantly sexual mode of transmission: Secondary | ICD-10-CM

## 2017-10-13 DIAGNOSIS — Z3685 Encounter for antenatal screening for Streptococcus B: Secondary | ICD-10-CM

## 2017-10-13 DIAGNOSIS — Z3493 Encounter for supervision of normal pregnancy, unspecified, third trimester: Secondary | ICD-10-CM

## 2017-10-13 LAB — POCT URINALYSIS DIPSTICK
BILIRUBIN UA: NEGATIVE
GLUCOSE UA: NEGATIVE
Ketones, UA: NEGATIVE
NITRITE UA: NEGATIVE
PH UA: 7 (ref 5.0–8.0)
PROTEIN UA: NEGATIVE
RBC UA: NEGATIVE
Spec Grav, UA: 1.01 (ref 1.010–1.025)
UROBILINOGEN UA: 0.2 U/dL

## 2017-10-13 MED ORDER — FLUCONAZOLE 150 MG PO TABS: 150.0000 mg | ORAL_TABLET | Freq: Once | ORAL | 3 refills | Status: AC

## 2017-10-13 NOTE — Progress Notes (Signed)
ROB- cultures obtained, discharge -foul odor, otherwise she is doing well

## 2017-10-13 NOTE — Progress Notes (Signed)
ROB- doing well, reports last week with increased green vaginal discharge.  + yeast on exam- labor precautions discussed. Cultures obtained.

## 2017-10-14 LAB — CBC
HEMATOCRIT: 33.1 % — AB (ref 34.0–46.6)
Hemoglobin: 11.1 g/dL (ref 11.1–15.9)
MCH: 28.3 pg (ref 26.6–33.0)
MCHC: 33.5 g/dL (ref 31.5–35.7)
MCV: 84 fL (ref 79–97)
Platelets: 148 10*3/uL — ABNORMAL LOW (ref 150–379)
RBC: 3.92 x10E6/uL (ref 3.77–5.28)
RDW: 17.1 % — AB (ref 12.3–15.4)
WBC: 8.3 10*3/uL (ref 3.4–10.8)

## 2017-10-14 LAB — FERRITIN: Ferritin: 18 ng/mL (ref 15–150)

## 2017-10-14 LAB — B12 AND FOLATE PANEL
Folate: 16.2 ng/mL (ref >3.0)
VITAMIN B 12: 223 pg/mL — AB (ref 232–1245)

## 2017-10-15 LAB — GC/CHLAMYDIA PROBE AMP
CHLAMYDIA, DNA PROBE: NEGATIVE
NEISSERIA GONORRHOEAE BY PCR: NEGATIVE

## 2017-10-15 LAB — STREP GP B NAA: Strep Gp B NAA: NEGATIVE

## 2017-10-20 ENCOUNTER — Ambulatory Visit (INDEPENDENT_AMBULATORY_CARE_PROVIDER_SITE_OTHER): Payer: BLUE CROSS/BLUE SHIELD | Admitting: Obstetrics and Gynecology

## 2017-10-20 VITALS — BP 123/65 | HR 86 | Wt 173.6 lb

## 2017-10-20 DIAGNOSIS — Z3493 Encounter for supervision of normal pregnancy, unspecified, third trimester: Secondary | ICD-10-CM

## 2017-10-20 LAB — POCT URINALYSIS DIPSTICK
BILIRUBIN UA: NEGATIVE
Blood, UA: NEGATIVE
Glucose, UA: NEGATIVE
KETONES UA: NEGATIVE
LEUKOCYTES UA: NEGATIVE
Nitrite, UA: NEGATIVE
PH UA: 6.5 (ref 5.0–8.0)
Protein, UA: NEGATIVE
SPEC GRAV UA: 1.01 (ref 1.010–1.025)
Urobilinogen, UA: 0.2 E.U./dL

## 2017-10-20 NOTE — Progress Notes (Signed)
ROB-reviewed negative cultures. 

## 2017-10-20 NOTE — Progress Notes (Signed)
ROB- pt is having some menstrual cramping

## 2017-10-28 ENCOUNTER — Encounter: Payer: Self-pay | Admitting: Anesthesiology

## 2017-10-28 ENCOUNTER — Inpatient Hospital Stay
Admission: EM | Admit: 2017-10-28 | Discharge: 2017-10-30 | DRG: 768 | Disposition: A | Discharge status: Home / Self Care | Payer: BLUE CROSS/BLUE SHIELD | Attending: Certified Nurse Midwife | Admitting: Certified Nurse Midwife

## 2017-10-28 DIAGNOSIS — D649 Anemia, unspecified: Secondary | ICD-10-CM | POA: Diagnosis present

## 2017-10-28 DIAGNOSIS — O9902 Anemia complicating childbirth: Secondary | ICD-10-CM | POA: Diagnosis present

## 2017-10-28 DIAGNOSIS — Z3A37 37 weeks gestation of pregnancy: Secondary | ICD-10-CM

## 2017-10-28 DIAGNOSIS — Z3483 Encounter for supervision of other normal pregnancy, third trimester: Secondary | ICD-10-CM | POA: Diagnosis present

## 2017-10-28 HISTORY — DX: Unspecified asthma, uncomplicated: J45.909

## 2017-10-28 LAB — TYPE AND SCREEN
ABO/RH(D): A POS
Antibody Screen: NEGATIVE

## 2017-10-28 LAB — CBC
HEMATOCRIT: 39 % (ref 35.0–47.0)
HEMOGLOBIN: 13 g/dL (ref 12.0–16.0)
MCH: 29.7 pg (ref 26.0–34.0)
MCHC: 33.2 g/dL (ref 32.0–36.0)
MCV: 89.4 fL (ref 80.0–100.0)
Platelets: 142 10*3/uL — ABNORMAL LOW (ref 150–440)
RBC: 4.37 MIL/uL (ref 3.80–5.20)
RDW: 17.6 % — ABNORMAL HIGH (ref 11.5–14.5)
WBC: 24.7 10*3/uL — ABNORMAL HIGH (ref 3.6–11.0)

## 2017-10-28 MED ORDER — LACTATED RINGERS IV SOLN: INTRAVENOUS | Status: DC

## 2017-10-28 MED ORDER — BENZOCAINE-MENTHOL 20-0.5 % EX AERO
1.0000 "application " | INHALATION_SPRAY | CUTANEOUS | Status: DC | PRN
  Administered 2017-10-29: 1 via TOPICAL
  Filled 2017-10-28 (×2): qty 56

## 2017-10-28 MED ORDER — OXYCODONE-ACETAMINOPHEN 5-325 MG PO TABS
1.0000 | ORAL_TABLET | ORAL | Status: DC | PRN
  Administered 2017-10-28: 1 via ORAL
  Filled 2017-10-28: qty 1

## 2017-10-28 MED ORDER — SIMETHICONE 80 MG PO CHEW
80.0000 mg | CHEWABLE_TABLET | ORAL | Status: DC | PRN
  Administered 2017-10-29: 80 mg via ORAL
  Filled 2017-10-28: qty 1

## 2017-10-28 MED ORDER — OXYTOCIN 10 UNIT/ML IJ SOLN
INTRAMUSCULAR | Status: AC
  Administered 2017-10-28: 20:00:00 via INTRAMUSCULAR
  Filled 2017-10-28: qty 2

## 2017-10-28 MED ORDER — PHENYLEPHRINE 40 MCG/ML (10ML) SYRINGE FOR IV PUSH (FOR BLOOD PRESSURE SUPPORT): 80.0000 ug | PREFILLED_SYRINGE | INTRAVENOUS | Status: DC | PRN

## 2017-10-28 MED ORDER — ONDANSETRON HCL 4 MG/2ML IJ SOLN: 4.0000 mg | INTRAMUSCULAR | Status: DC | PRN

## 2017-10-28 MED ORDER — LACTATED RINGERS IV SOLN: 500.0000 mL | INTRAVENOUS | Status: DC | PRN

## 2017-10-28 MED ORDER — LIDOCAINE HCL (PF) 1 % IJ SOLN: 30.0000 mL | INTRAMUSCULAR | Status: DC | PRN

## 2017-10-28 MED ORDER — DIBUCAINE 1 % RE OINT
1.0000 "application " | TOPICAL_OINTMENT | RECTAL | Status: DC | PRN
  Administered 2017-10-29: 1 via RECTAL
  Filled 2017-10-28 (×2): qty 28

## 2017-10-28 MED ORDER — EPHEDRINE 5 MG/ML INJ: 10.0000 mg | INTRAVENOUS | Status: DC | PRN

## 2017-10-28 MED ORDER — BUTORPHANOL TARTRATE 2 MG/ML IJ SOLN: 1.0000 mg | INTRAMUSCULAR | Status: DC | PRN

## 2017-10-28 MED ORDER — OXYTOCIN BOLUS FROM INFUSION: 500.0000 mL | Freq: Once | INTRAVENOUS | Status: DC

## 2017-10-28 MED ORDER — OXYTOCIN 40 UNITS IN LACTATED RINGERS INFUSION - SIMPLE MED
INTRAVENOUS | Status: AC
  Filled 2017-10-28: qty 1000

## 2017-10-28 MED ORDER — AMMONIA AROMATIC IN INHA
RESPIRATORY_TRACT | Status: AC
  Filled 2017-10-28: qty 10

## 2017-10-28 MED ORDER — WITCH HAZEL-GLYCERIN EX PADS
1.0000 "application " | MEDICATED_PAD | CUTANEOUS | Status: DC | PRN
  Administered 2017-10-29: 1 via TOPICAL
  Filled 2017-10-28: qty 100

## 2017-10-28 MED ORDER — OXYCODONE HCL 5 MG PO TABS
5.0000 mg | ORAL_TABLET | ORAL | Status: DC | PRN
  Administered 2017-10-29 (×4): 5 mg via ORAL
  Filled 2017-10-28 (×4): qty 1

## 2017-10-28 MED ORDER — LACTATED RINGERS IV SOLN: 500.0000 mL | Freq: Once | INTRAVENOUS | Status: DC

## 2017-10-28 MED ORDER — DIPHENHYDRAMINE HCL 50 MG/ML IJ SOLN: 12.5000 mg | INTRAMUSCULAR | Status: DC | PRN

## 2017-10-28 MED ORDER — ZOLPIDEM TARTRATE 5 MG PO TABS: 5.0000 mg | ORAL_TABLET | Freq: Every evening | ORAL | Status: DC | PRN

## 2017-10-28 MED ORDER — OXYCODONE HCL 5 MG PO TABS
10.0000 mg | ORAL_TABLET | ORAL | Status: DC | PRN
  Administered 2017-10-30: 10 mg via ORAL
  Filled 2017-10-28: qty 2

## 2017-10-28 MED ORDER — PRENATAL MULTIVITAMIN CH
1.0000 | ORAL_TABLET | Freq: Every day | ORAL | Status: DC
  Administered 2017-10-29: 1 via ORAL
  Filled 2017-10-28: qty 1

## 2017-10-28 MED ORDER — ONDANSETRON HCL 4 MG/2ML IJ SOLN: 4.0000 mg | Freq: Four times a day (QID) | INTRAMUSCULAR | Status: DC | PRN

## 2017-10-28 MED ORDER — OXYTOCIN 40 UNITS IN LACTATED RINGERS INFUSION - SIMPLE MED: 2.5000 [IU]/h | INTRAVENOUS | Status: DC

## 2017-10-28 MED ORDER — OXYCODONE-ACETAMINOPHEN 5-325 MG PO TABS: 2.0000 | ORAL_TABLET | ORAL | Status: DC | PRN

## 2017-10-28 MED ORDER — DIPHENHYDRAMINE HCL 25 MG PO CAPS: 25.0000 mg | ORAL_CAPSULE | Freq: Four times a day (QID) | ORAL | Status: DC | PRN

## 2017-10-28 MED ORDER — LIDOCAINE HCL (PF) 1 % IJ SOLN
INTRAMUSCULAR | Status: AC
  Filled 2017-10-28: qty 30

## 2017-10-28 MED ORDER — FLEET ENEMA 7-19 GM/118ML RE ENEM: 1.0000 | ENEMA | Freq: Once | RECTAL | Status: DC

## 2017-10-28 MED ORDER — MISOPROSTOL 200 MCG PO TABS
ORAL_TABLET | ORAL | Status: AC
  Filled 2017-10-28: qty 4

## 2017-10-28 MED ORDER — ONDANSETRON HCL 4 MG PO TABS: 4.0000 mg | ORAL_TABLET | ORAL | Status: DC | PRN

## 2017-10-28 MED ORDER — IBUPROFEN 600 MG PO TABS
600.0000 mg | ORAL_TABLET | Freq: Four times a day (QID) | ORAL | Status: DC
  Administered 2017-10-28 – 2017-10-30 (×7): 600 mg via ORAL
  Filled 2017-10-28 (×7): qty 1

## 2017-10-28 MED ORDER — FENTANYL 2.5 MCG/ML BUPIVACAINE 1/10 % EPIDURAL INFUSION (WH - ANES)
12.0000 mL/h | INTRAMUSCULAR | Status: DC | PRN
Start: 2017-10-28 — End: 2017-10-28

## 2017-10-28 MED ORDER — COCONUT OIL OIL
1.0000 "application " | TOPICAL_OIL | Status: DC | PRN
  Filled 2017-10-28: qty 120

## 2017-10-28 MED ORDER — ACETAMINOPHEN 325 MG PO TABS: 650.0000 mg | ORAL_TABLET | ORAL | Status: DC | PRN

## 2017-10-28 MED ORDER — ACETAMINOPHEN 325 MG PO TABS
650.0000 mg | ORAL_TABLET | ORAL | Status: DC | PRN
  Administered 2017-10-29 (×2): 650 mg via ORAL
  Filled 2017-10-28 (×2): qty 2

## 2017-10-28 MED ORDER — DOCUSATE SODIUM 100 MG PO CAPS
100.0000 mg | ORAL_CAPSULE | Freq: Two times a day (BID) | ORAL | Status: DC
  Administered 2017-10-29 – 2017-10-30 (×3): 100 mg via ORAL
  Filled 2017-10-28 (×3): qty 1

## 2017-10-28 MED ORDER — SOD CITRATE-CITRIC ACID 500-334 MG/5ML PO SOLN: 30.0000 mL | ORAL | Status: DC | PRN

## 2017-10-28 NOTE — Anesthesia Preprocedure Evaluation (Deleted)
Anesthesia Evaluation  Patient identified by MRN, date of birth, ID band Patient awake    Reviewed: Allergy & Precautions, NPO status , Patient's Chart, lab work & pertinent test results, reviewed documented beta blocker date and time   Airway Mallampati: III  TM Distance: >3 FB     Dental  (+) Chipped   Pulmonary asthma ,           Cardiovascular      Neuro/Psych    GI/Hepatic   Endo/Other    Renal/GU      Musculoskeletal   Abdominal   Peds  Hematology   Anesthesia Other Findings   Reproductive/Obstetrics                             Anesthesia Physical Anesthesia Plan  ASA: II  Anesthesia Plan: Epidural   Post-op Pain Management:    Induction:   PONV Risk Score and Plan:   Airway Management Planned:   Additional Equipment:   Intra-op Plan:   Post-operative Plan:   Informed Consent: I have reviewed the patients History and Physical, chart, labs and discussed the procedure including the risks, benefits and alternatives for the proposed anesthesia with the patient or authorized representative who has indicated his/her understanding and acceptance.     Plan Discussed with: CRNA  Anesthesia Plan Comments:         Anesthesia Quick Evaluation

## 2017-10-28 NOTE — OB Triage Note (Signed)
Patient presents to triage for complaints of contractions since 11 am which became stronger around 2 pm when they are 7 min's apart. Mucous discharge blood tinged.

## 2017-10-28 NOTE — H&P (Signed)
Obstetric History and Physical  Dora SimsKelsey Dowland is a 26 y.o. G1P0000 with IUP at 7092w6d presenting with uterine contractions. Patient states she has been having  regular, every three (3) minutes contractions, none vaginal bleeding, intact membranes, with active fetal movement.    Denies difficulty breathing or respiratory distress, chest pain, dysuria, and leg pain or swelling.   Prenatal Course  [facility] visit: 11 wks, total visits: 10  Pregnancy complications or risks: None  Prenatal labs and studies:  ABO, Rh: A/Positive/-- (04/23 1519)  Antibody: Negative (04/23 1519)  Rubella: 5.33 (04/23 1519)  RPR: Non Reactive (04/23 1519)   HBsAg: Negative (04/23 1519)   HIV: Non Reactive (04/23 1519)  ZOX:WRUEAVWUGBS:Negative (11/02 1727)  1 hr Glucola: 142  3 hr GTT: 76, 107, 152, 136  Genetic screening normal  Anatomy US normal  Past Medical History:  Diagnosis Date  . Arrhythmia   . Asthma   . Hand tendonitis   . Heavy periods   . Mixed hypoglycemia   . Overweight   . Painful menstrual periods   . UTI (lower urinary tract infection)     History reviewed. No pertinent surgical history.  OB History  Gravida Para Term Preterm AB Living  1 0 0 0 0 0  SAB TAB Ectopic Multiple Live Births  0 0 0 0      # Outcome Date GA Lbr Len/2nd Weight Sex Delivery Anes PTL Lv  1 Current               Social History   Socioeconomic History  . Marital status: Married    Spouse name: None  . Number of children: None  . Years of education: None  . Highest education level: None  Social Needs  . Financial resource strain: None  . Food insecurity - worry: None  . Food insecurity - inability: None  . Transportation needs - medical: None  . Transportation needs - non-medical: None  Occupational History    Employer: Production assistant, [employer]  Tobacco Use  . Smoking status: Never Smoker  . Smokeless tobacco: Never Used  Substance and Sexual Activity  .  Alcohol use: No    Alcohol/week: 0.0 oz  . Drug use: No  . Sexual activity: Yes    Partners: Male  Other Topics Concern  . None  Social History Narrative  . None    Family History  Problem Relation Age of Onset  . Heart disease Father   . Cancer Maternal Grandmother        breast (mgm sister)    Medications Prior to Admission  Medication Sig Dispense Refill Last Dose  . Iron-FA-B Cmp-C-Biot-Probiotic (FUSION PLUS) CAPS Take 1 capsule by mouth daily. 60 capsule 1 10/28/2017 at Unknown time  . Prenatal Vit-Fe Fumarate-FA (PRENATAL MULTIVITAMIN) TABS tablet Take 1 tablet by mouth daily at 12 noon.   10/28/2017 at Unknown time  . folic acid (FOLVITE) 1 MG tablet Take 1 mg by mouth daily.   Not Taking at Unknown time    No Known Allergies  Review of Systems: Negative except for what is mentioned in HPI.  Physical Exam: BP (!) 141/84   Pulse 87   Temp 99.2 F (37.3 C) (Oral)   LMP 02/05/2017 (Exact Date)    GENERAL: Well-developed, well-nourished female in no acute distress.   LUNGS: Clear to auscultation bilaterally.   HEART: Regular rate and rhythm.  ABDOMEN: Soft, nontender, nondistended, gravid.  EXTREMITIES: Nontender, no edema,  2+ distal pulses.  Cervical Exam: Dilation: 10 Dilation Complete Date: 10/28/17 Dilation Complete Time: 1854 Effacement (%): 100 Station: 0 Exam by:: [email or web address]  FHT:  Baseline rate 145 bpm   Variability moderate  Accelerations present   Decelerations none  Contractions: Every one (1) to four (4) minutes, soft resting tone   Pertinent Labs/Studies:    No results found for this or any previous visit (from the past 24 hour(s)).  Assessment : Dora SimsKelsey Sanks is a 26 y.o. G1P0000 at 9869w6d being admitted for labor, Rh positive, GBS negative  FHR Category I  Plan:  Labor: Expectant management.  Delivery plan: Hopeful for vaginal delivery  Gunnar BullaJenkins Michelle Refugia Laneve, CNM [facility], [facility]

## 2017-10-29 LAB — CBC
HCT: 31.3 % — ABNORMAL LOW (ref 35.0–47.0)
HEMOGLOBIN: 10.1 g/dL — AB (ref 12.0–16.0)
MCH: 28.8 pg (ref 26.0–34.0)
MCHC: 32.4 g/dL (ref 32.0–36.0)
MCV: 88.7 fL (ref 80.0–100.0)
PLATELETS: 128 10*3/uL — AB (ref 150–440)
RBC: 3.52 MIL/uL — AB (ref 3.80–5.20)
RDW: 17.8 % — ABNORMAL HIGH (ref 11.5–14.5)
WBC: 13.6 10*3/uL — AB (ref 3.6–11.0)

## 2017-10-29 NOTE — Progress Notes (Signed)
Post Partum Day 1 Subjective: up ad lib and voiding  Objective: Blood pressure 112/60, pulse 98, temperature 98 F (36.7 C), temperature source Oral, resp. rate 18, weight 175 lb (79.4 kg), last menstrual period 02/05/2017, SpO2 99 %.  Physical Exam:  General: alert, cooperative, appears stated age and fatigued Lochia: appropriate Uterine Fundus: firm Incision: NA DVT Evaluation: No evidence of DVT seen on physical exam. Negative Homan's sign. Calf/Ankle edema is present.  Recent Labs    10/28/17 2116 10/29/17 0649  HGB 13.0 10.1*  HCT 39.0 31.3*    Assessment/Plan: Plan for discharge tomorrow, Breastfeeding and Circumcision prior to discharge Infant feeding Breast    LOS: 1 day   Melody N Shambley 10/29/2017, 8:08 PM

## 2017-10-29 NOTE — Plan of Care (Signed)
Afeb. VSS. Color good, skin w&d. BBS clear Fundus is firm at U/E with moderate rubra. No clots and no continues trickle. Pt. Assisted up to void and voided 350cc of urine. Will cont. To follow closely.

## 2017-10-29 NOTE — Plan of Care (Signed)
Alert and oriented with cheerful affect. Color good, skin w&d. BBS cl. Afeb. VSS. Fundus is firm at U/-1 with small amount of rubra lochia. States pain control with scheduled Ibuprofen and PRN oxy. Denies c/o and providing self care..Marland Kitchen

## 2017-10-30 ENCOUNTER — Encounter: Payer: BLUE CROSS/BLUE SHIELD | Admitting: Obstetrics and Gynecology

## 2017-10-30 LAB — RPR: RPR: NONREACTIVE

## 2017-10-30 MED ORDER — NORETHINDRONE 0.35 MG PO TABS: 1.0000 | ORAL_TABLET | Freq: Every day | ORAL | 11 refills | Status: DC

## 2017-10-30 MED ORDER — DOCUSATE SODIUM 100 MG PO CAPS: 100.0000 mg | ORAL_CAPSULE | Freq: Every day | ORAL | 1 refills | Status: DC

## 2017-10-30 MED ORDER — IBUPROFEN 600 MG PO TABS: 600.0000 mg | ORAL_TABLET | Freq: Four times a day (QID) | ORAL | 0 refills | Status: DC

## 2017-10-30 MED ORDER — VITAMIN D3 125 MCG (5000 UT) PO CAPS: 1.0000 | ORAL_CAPSULE | Freq: Every day | ORAL | 2 refills | Status: DC

## 2017-10-30 NOTE — Discharge Summary (Signed)
Physician Obstetric Discharge Summary  Patient ID: Selena White MRN: [id number] DOB/AGE: 1991-01-13 26 y.o.   Date of Admission: 10/28/2017  Date of Discharge:   Admitting Diagnosis: Onset of Labor at 6176w1d  Secondary Diagnosis: Anemia in pregnancy  Mode of Delivery: normal spontaneous vaginal delivery     Discharge Diagnosis: none   Intrapartum Procedures: none   Post partum procedures: none  Complications: 3rd degree perineal laceration   Brief Hospital Course  Selena White is a G1P0000 who had a SVD on 10/28/17;  for further details of this surgery, please refer to the delivey note.  Patient had an uncomplicated postpartum course.  By time of discharge on PPD#2, her pain was controlled on oral pain medications; she had appropriate lochia and was ambulating, voiding without difficulty and tolerating regular diet.  She was deemed stable for discharge to home.     Labs: CBC Latest Ref Rng & Units 10/29/2017 10/28/2017 10/13/2017  WBC 3.6 - 11.0 K/uL 13.6(H) 24.7(H) 8.3  Hemoglobin 12.0 - 16.0 g/dL 10.1(L) 13.0 11.1  Hematocrit 35.0 - 47.0 % 31.3(L) 39.0 33.1(L)  Platelets 150 - 440 K/uL 128(L) 142(L) 148(L)   A POS  Physical exam:  Blood pressure 111/72, pulse 99, temperature 98.7 F (37.1 C), resp. rate 20, weight 175 lb (79.4 kg), last menstrual period 02/05/2017, SpO2 99 %. General: alert and no distress Lochia: appropriate Abdomen: soft, NT Uterine Fundus: firm  Extremities: No evidence of DVT seen on physical exam. Moderate non-pitting lower extremity edema.  Discharge Instructions: Per After Visit Summary. Activity: Advance as tolerated. Pelvic rest for 6 weeks.  Also refer to After Visit Summary Diet: Regular Medications: Allergies as of 10/30/2017   No Known Allergies     Medication List    TAKE these medications   docusate sodium 100 MG capsule Commonly known as:  COLACE Take 1 capsule (100 mg total) daily by mouth.   folic acid 1 MG  tablet Commonly known as:  FOLVITE Take 1 mg by mouth daily.   FUSION PLUS Caps Take 1 capsule by mouth daily.   ibuprofen 600 MG tablet Commonly known as:  ADVIL,MOTRIN Take 1 tablet (600 mg total) every 6 (six) hours by mouth.   norethindrone 0.35 MG tablet Commonly known as:  MICRONOR,CAMILA,ERRIN Take 1 tablet (0.35 mg total) daily by mouth.   prenatal multivitamin Tabs tablet Take 1 tablet by mouth daily at 12 noon.   Vitamin D3 5000 units Caps Take 1 capsule (5,000 Units total) daily by mouth.      Outpatient follow up:  Postpartum contraception: oral progesterone-only contraceptive  Discharged Condition: good  Discharged to: home   Newborn Data: Disposition:home with mother  Apgars: APGAR (1 MIN): 7   APGAR (5 MINS): 8   APGAR (10 MINS):    Baby Feeding: Breast  Selena White, Selena White

## 2017-10-30 NOTE — Progress Notes (Signed)
Discharge inst reviewed with pt.  Rx called in to pharmacy.  Pt verb u/o

## 2017-11-10 ENCOUNTER — Encounter: Payer: BLUE CROSS/BLUE SHIELD | Admitting: Obstetrics and Gynecology

## 2017-12-15 ENCOUNTER — Ambulatory Visit (INDEPENDENT_AMBULATORY_CARE_PROVIDER_SITE_OTHER): Payer: BLUE CROSS/BLUE SHIELD | Admitting: Obstetrics and Gynecology

## 2017-12-15 ENCOUNTER — Encounter: Payer: Self-pay | Admitting: Obstetrics and Gynecology

## 2017-12-15 NOTE — Progress Notes (Signed)
  Subjective:     Selena SimsKelsey Jauregui is a 27 y.o. female who presents for a postpartum visit. She is 6 weeks postpartum following a VAVD. I have fully reviewed the prenatal and intrapartum course. The delivery was at 36 gestational weeks. Anesthesia: epidural. Postpartum course has been uncomplicated. Baby's course has been uncomplicated. Baby is feeding by breast. Bleeding no bleeding. Bowel function is normal. Bladder function is normal. Patient is not sexually active. Contraception method is abstinence. Postpartum depression screening: negative.  The following portions of the patient's history were reviewed and updated as appropriate: allergies, current medications, past family history, past medical history, past social history, past surgical history and problem list.  Review of Systems A comprehensive review of systems was negative.   Objective:    BP (!) 105/51   Pulse 74   Wt 153 lb 1.6 oz (69.4 kg)   LMP 02/05/2017 (Exact Date)   Breastfeeding? Yes   BMI 28.93 kg/m   General:  alert, cooperative and appears stated age   Breasts:  inspection negative, no nipple discharge or bleeding, no masses or nodularity palpable  Lungs: clear to auscultation bilaterally  Heart:  regular rate and rhythm, S1, S2 normal, no murmur, click, rub or gallop  Abdomen: soft, non-tender; bowel sounds normal; no masses,  no organomegaly   Vulva:  normal  Vagina: normal vagina, no discharge, exudate, lesion, or erythema  Cervix:  multiparous appearance  Corpus: normal size, contour, position, consistency, mobility, non-tender  Adnexa:  no mass, fullness, tenderness  Rectal Exam: Not performed.        Assessment:     6 weeks postpartum exam. Pap smear not done at today's visit.   Plan:    1. Contraception: oral progesterone-only contraceptive 2. Labs obtained-will follow up accordingly 3. Follow up in: 4 months for Annual or as needed.

## 2017-12-15 NOTE — Patient Instructions (Signed)
  Place postpartum visit patient instructions here.  

## 2017-12-16 LAB — CBC
HEMOGLOBIN: 13.1 g/dL (ref 11.1–15.9)
Hematocrit: 39.4 % (ref 34.0–46.6)
MCH: 29 pg (ref 26.6–33.0)
MCHC: 33.2 g/dL (ref 31.5–35.7)
MCV: 87 fL (ref 79–97)
PLATELETS: 221 10*3/uL (ref 150–379)
RBC: 4.52 x10E6/uL (ref 3.77–5.28)
RDW: 13.8 % (ref 12.3–15.4)
WBC: 5 10*3/uL (ref 3.4–10.8)

## 2017-12-16 LAB — FERRITIN: FERRITIN: 16 ng/mL (ref 15–150)

## 2017-12-16 LAB — VITAMIN D 25 HYDROXY (VIT D DEFICIENCY, FRACTURES): VIT D 25 HYDROXY: 40.4 ng/mL (ref 30.0–100.0)

## 2018-02-27 ENCOUNTER — Other Ambulatory Visit: Payer: Self-pay | Admitting: *Deleted

## 2018-02-27 MED ORDER — VITAMIN D3 125 MCG (5000 UT) PO CAPS: 1.0000 | ORAL_CAPSULE | Freq: Every day | ORAL | 2 refills | Status: DC

## 2018-04-19 ENCOUNTER — Encounter: Payer: Self-pay | Admitting: Obstetrics and Gynecology

## 2018-04-19 ENCOUNTER — Ambulatory Visit (INDEPENDENT_AMBULATORY_CARE_PROVIDER_SITE_OTHER): Payer: BLUE CROSS/BLUE SHIELD | Admitting: Obstetrics and Gynecology

## 2018-04-19 VITALS — BP 99/72 | HR 95 | Ht 61.0 in | Wt 160.5 lb

## 2018-04-19 DIAGNOSIS — Z01419 Encounter for gynecological examination (general) (routine) without abnormal findings: Secondary | ICD-10-CM

## 2018-04-19 DIAGNOSIS — N912 Amenorrhea, unspecified: Secondary | ICD-10-CM | POA: Diagnosis not present

## 2018-04-19 NOTE — Progress Notes (Signed)
Subjective:   Selena White is a 27 y.o. G77P0000 Caucasian female here for a routine well-woman exam.  No LMP recorded. (Menstrual status: Lactating).    Current complaints: none PCP: me       doesn't desire labs  Social History: Sexual: heterosexual Marital Status: married Living situation: with family Occupation: homemaker Tobacco/alcohol: no tobacco use Illicit drugs: no history of illicit drug use  The following portions of the patient's history were reviewed and updated as appropriate: allergies, current medications, past family history, past medical history, past social history, past surgical history and problem list.  Past Medical History Past Medical History:  Diagnosis Date  . Arrhythmia   . Asthma   . Hand tendonitis   . Heavy periods   . Mixed hypoglycemia   . Overweight   . Painful menstrual periods   . UTI (lower urinary tract infection)     Past Surgical History History reviewed. No pertinent surgical history.  Gynecologic History G1P0000  No LMP recorded. (Menstrual status: Lactating). Contraception: oral progesterone-only contraceptive Last Pap: 2017. Results were: normal   Obstetric History OB History  Gravida Para Term Preterm AB Living  1 0 0 0 0 0  SAB TAB Ectopic Multiple Live Births  0 0 0 0      # Outcome Date GA Lbr Len/2nd Weight Sex Delivery Anes PTL Lv  1 Gravida             Current Medications Current Outpatient Medications on File Prior to Visit  Medication Sig Dispense Refill  . Cholecalciferol (VITAMIN D3) 5000 units CAPS Take 1 capsule (5,000 Units total) by mouth daily. 120 capsule 2  . norethindrone (MICRONOR,CAMILA,ERRIN) 0.35 MG tablet Take 1 tablet (0.35 mg total) daily by mouth. 1 Package 11   No current facility-administered medications on file prior to visit.     Review of Systems Patient denies any headaches, blurred vision, shortness of breath, chest pain, abdominal pain, problems with bowel movements, urination, or  intercourse.  Objective:  BP 99/72   Pulse 95   Ht  (1.549 m)   Wt 160 lb 8 oz (72.8 kg)   Breastfeeding? Yes   BMI 30.33 kg/m  Physical Exam  General:  Well developed, well nourished, no acute distress. She is alert and oriented x3. Skin:  Warm and dry Neck:  Midline trachea, no thyromegaly or nodules Cardiovascular: Regular rate and rhythm, no murmur heard Lungs:  Effort normal, all lung fields clear to auscultation bilaterally Breasts:  No dominant palpable mass, retraction, or nipple discharge Abdomen:  Soft, non tender, no hepatosplenomegaly or masses Pelvic:  External genitalia is normal in appearance.  The vagina is normal in appearance. The cervix is bulbous, no CMT.  Thin prep pap is not done . Uterus is felt to be normal size, shape, and contour.  No adnexal masses or tenderness noted. Extremities:  No swelling or varicosities noted Psych:  She has a normal mood and affect  Assessment:   Healthy well-woman exam Lactational amenorrhea Overweight  Plan:   F/U 1 year for AE, or sooner if needed   Daniel Ritthaler Suzan Nailer, CNM

## 2018-04-19 NOTE — Patient Instructions (Signed)
Preventive Care 18-39 Years, Female Preventive care refers to lifestyle choices and visits with your health care provider that can promote health and wellness. What does preventive care include?  A yearly physical exam. This is also called an annual well check.  Dental exams once or twice a year.  Routine eye exams. Ask your health care provider how often you should have your eyes checked.  Personal lifestyle choices, including: ? Daily care of your teeth and gums. ? Regular physical activity. ? Eating a healthy diet. ? Avoiding tobacco and drug use. ? Limiting alcohol use. ? Practicing safe sex. ? Taking vitamin and mineral supplements as recommended by your health care provider. What happens during an annual well check? The services and screenings done by your health care provider during your annual well check will depend on your age, overall health, lifestyle risk factors, and family history of disease. Counseling Your health care provider may ask you questions about your:  Alcohol use.  Tobacco use.  Drug use.  Emotional well-being.  Home and relationship well-being.  Sexual activity.  Eating habits.  Work and work Statistician.  Method of birth control.  Menstrual cycle.  Pregnancy history.  Screening You may have the following tests or measurements:  Height, weight, and BMI.  Diabetes screening. This is done by checking your blood sugar (glucose) after you have not eaten for a while (fasting).  Blood pressure.  Lipid and cholesterol levels. These may be checked every 5 years starting at age 38.  Skin check.  Hepatitis C blood test.  Hepatitis B blood test.  Sexually transmitted disease (STD) testing.  BRCA-related cancer screening. This may be done if you have a family history of breast, ovarian, tubal, or peritoneal cancers.  Pelvic exam and Pap test. This may be done every 3 years starting at age 38. Starting at age 30, this may be done  every 5 years if you have a Pap test in combination with an HPV test.  Discuss your test results, treatment options, and if necessary, the need for more tests with your health care provider. Vaccines Your health care provider may recommend certain vaccines, such as:  Influenza vaccine. This is recommended every year.  Tetanus, diphtheria, and acellular pertussis (Tdap, Td) vaccine. You may need a Td booster every 10 years.  Varicella vaccine. You may need this if you have not been vaccinated.  HPV vaccine. If you are 39 or younger, you may need three doses over 6 months.  Measles, mumps, and rubella (MMR) vaccine. You may need at least one dose of MMR. You may also need a second dose.  Pneumococcal 13-valent conjugate (PCV13) vaccine. You may need this if you have certain conditions and were not previously vaccinated.  Pneumococcal polysaccharide (PPSV23) vaccine. You may need one or two doses if you smoke cigarettes or if you have certain conditions.  Meningococcal vaccine. One dose is recommended if you are age 68-21 years and a first-year college student living in a residence hall, or if you have one of several medical conditions. You may also need additional booster doses.  Hepatitis A vaccine. You may need this if you have certain conditions or if you travel or work in places where you may be exposed to hepatitis A.  Hepatitis B vaccine. You may need this if you have certain conditions or if you travel or work in places where you may be exposed to hepatitis B.  Haemophilus influenzae type b (Hib) vaccine. You may need this  if you have certain risk factors.  Talk to your health care provider about which screenings and vaccines you need and how often you need them. This information is not intended to replace advice given to you by your health care provider. Make sure you discuss any questions you have with your health care provider. Document Released: 01/24/2002 Document Revised:  08/17/2016 Document Reviewed: 09/29/2015 Elsevier Interactive Patient Education  2018 Elsevier Inc.  

## 2018-08-02 ENCOUNTER — Encounter: Payer: Self-pay | Admitting: Obstetrics and Gynecology

## 2018-08-02 ENCOUNTER — Ambulatory Visit: Payer: BLUE CROSS/BLUE SHIELD | Admitting: Obstetrics and Gynecology

## 2018-08-02 VITALS — BP 109/70 | HR 114 | Temp 99.4°F | Ht 61.0 in | Wt 159.4 lb

## 2018-08-02 DIAGNOSIS — N61 Mastitis without abscess: Secondary | ICD-10-CM

## 2018-08-02 MED ORDER — DICLOXACILLIN SODIUM 500 MG PO CAPS: 500.0000 mg | ORAL_CAPSULE | Freq: Four times a day (QID) | ORAL | 1 refills | Status: DC

## 2018-08-02 NOTE — Patient Instructions (Signed)
Mastitis  Mastitis is inflammation of the breast tissue. It occurs most often in women who are breastfeeding, but it can also affect other women, and even sometimes men.  What are the causes?  Mastitis is usually caused by a bacterial infection. Bacteria enter the breast tissue through cuts or openings in the skin. Typically, this occurs with breastfeeding because of cracked or irritated skin. Sometimes, it can occur even when there is no opening in the skin. It can be associated with plugged milk (lactiferous) ducts. Nipple piercing can also lead to mastitis. Also, some forms of breast cancer can cause mastitis.  What are the signs or symptoms?  · Swelling, redness, tenderness, and pain in an area of the breast.  · Swelling of the glands under the arm on the same side.  · Fever.  If an infection is allowed to progress, a collection of pus (abscess) may develop.  How is this diagnosed?  Your health care provider can usually diagnose mastitis based on your symptoms and a physical exam. Tests may be done to help confirm the diagnosis. These may include:  · Removal of pus from the breast by applying pressure to the area. This pus can be examined in the lab to determine which bacteria are present. If an abscess has developed, the fluid in the abscess can be removed with a needle. This can also be used to confirm the diagnosis and determine the bacteria present. In most cases, pus will not be present.  · Blood tests to determine if your body is fighting a bacterial infection.  · Mammogram or ultrasound tests to rule out other problems or diseases.    How is this treated?  Antibiotic medicine is used to treat a bacterial infection. Your health care provider will determine which bacteria are most likely causing the infection and will select an appropriate antibiotic. This is sometimes changed based on the results of tests performed to identify the bacteria, or if there is no response to the antibiotic selected. Antibiotics  are usually given by mouth. You may also be given medicine for pain.  Mastitis that occurs with breastfeeding will sometimes go away on its own, so your health care provider may choose to wait 24 hours after first seeing you to decide whether a prescription medicine is needed.  Follow these instructions at home:  · Only take over-the-counter or prescription medicines for pain, fever, or discomfort as directed by your health care provider.  · If your health care provider prescribed an antibiotic, take the medicine as directed. Make sure you finish it even if you start to feel better.  · Do not wear a tight or underwire bra. Wear a soft, supportive bra.  · Increase your fluid intake, especially if you have a fever.  · Women who are breastfeeding should follow these instructions:  ? Continue to empty the breast. Your health care provider can tell you whether this milk is safe for your infant or needs to be thrown out. You may be told to stop nursing until your health care provider thinks it is safe for your baby. Use a breast pump if you are advised to stop nursing.  ? Keep your nipples clean and dry.  ? Empty the first breast completely before going to the other breast. If your baby is not emptying your breasts completely for some reason, use a breast pump to empty your breasts.  ? If you go back to work, pump your breasts while at work to stay   in time with your nursing schedule.  ? Avoid allowing your breasts to become overly filled with milk (engorged).  Contact a health care provider if:  · You have pus-like discharge from the breast.  · Your symptoms do not improve with the treatment prescribed by your health care provider within 2 days.  Get help right away if:  · Your pain and swelling are getting worse.  · You have pain that is not controlled with medicine.  · You have a red line extending from the breast toward your armpit.  · You have a fever or persistent symptoms for more than 2-3 days.  · You have a fever  and your symptoms suddenly get worse.  This information is not intended to replace advice given to you by your health care provider. Make sure you discuss any questions you have with your health care provider.  Document Released: 11/28/2005 Document Revised: 05/05/2016 Document Reviewed: 06/28/2013  Elsevier Interactive Patient Education © 2017 Elsevier Inc.

## 2018-08-02 NOTE — Progress Notes (Signed)
  Subjective:     Patient ID: Selena White, female   DOB: 1991/02/08, 27 y.o.   MRN: [id number]  HPI  Reports mild inner right breast tenderness for about 4-5 days, that got worse this am. Redness noted in same area. Low grade fever and chills that started last night. General body aches and headache since last night too. Is still nursing infant 4-6 times in 24 hours. Pumping 1-2 of those feedings daily.   Review of Systems  Constitutional: Positive for chills, diaphoresis and fever.  Respiratory: Negative.   Cardiovascular: Negative.   Gastrointestinal: Negative.   Genitourinary: Negative.   Musculoskeletal: Positive for myalgias.  Neurological: Positive for headaches.  Hematological: Negative.   Psychiatric/Behavioral: Negative.        Objective:   Physical Exam A&Ox4 Well groomed female in mild distress Blood pressure 109/70, pulse (!) 114, temperature 99.4 F (37.4 C), height 5\' 1"  (1.549 m), weight 159 lb 6.4 oz (72.3 kg), currently breastfeeding. Breasts: left breast normal without mass, skin or nipple changes or axillary nodes, lactating, with right breast inner erythema & tenderness, both nipples normal.    Assessment:     Right breast mastitis    Plan:     Counseled on findings and treatment. Encouraged rest, hydration and tylenol use as directed. Antibiotics prescribed.  RTC as needed.  Edrei Norgaard,CNM

## 2018-08-30 ENCOUNTER — Other Ambulatory Visit: Payer: Self-pay | Admitting: Obstetrics and Gynecology

## 2018-08-30 MED ORDER — DESOGESTREL-ETHINYL ESTRADIOL 0.15-0.02/0.01 MG (21/5) PO TABS: 1.0000 | ORAL_TABLET | Freq: Every day | ORAL | 11 refills | Status: DC

## 2018-12-20 ENCOUNTER — Encounter: Payer: Self-pay | Admitting: Obstetrics and Gynecology

## 2018-12-20 ENCOUNTER — Ambulatory Visit (INDEPENDENT_AMBULATORY_CARE_PROVIDER_SITE_OTHER): Payer: Managed Care, Other (non HMO) | Admitting: Obstetrics and Gynecology

## 2018-12-20 VITALS — BP 130/84 | HR 98 | Ht 61.0 in | Wt 164.2 lb

## 2018-12-20 DIAGNOSIS — Z6831 Body mass index (BMI) 31.0-31.9, adult: Secondary | ICD-10-CM | POA: Diagnosis not present

## 2018-12-20 DIAGNOSIS — E6609 Other obesity due to excess calories: Secondary | ICD-10-CM | POA: Diagnosis not present

## 2018-12-20 DIAGNOSIS — Z23 Encounter for immunization: Secondary | ICD-10-CM | POA: Diagnosis not present

## 2018-12-20 MED ORDER — PHENTERMINE HCL 37.5 MG PO TABS: 37.5000 mg | ORAL_TABLET | Freq: Every day | ORAL | 1 refills | Status: DC

## 2018-12-20 MED ORDER — CYANOCOBALAMIN 1000 MCG/ML IJ SOLN: 1000.0000 ug | INTRAMUSCULAR | 1 refills | Status: DC

## 2018-12-20 NOTE — Progress Notes (Signed)
Waist- 37 in  

## 2018-12-20 NOTE — Progress Notes (Signed)
Subjective:  Selena White is a 28 y.o. G1P0000 at Unknown being seen today for weight loss management- initial visit.  Patient reports General ROS: negative and reports previous weight loss attempts:successful with adipex.  Onset followed:   recent pregnancy. Associated symptoms include: fatigue,  change in clothing fit. Previous/Current treatment includes: small frequent feedings, nutritional supplement, vitamin supplement, vitamin B-12 injections and appetite supressant.    The following portions of the patient's history were reviewed and updated as appropriate: allergies, current medications, past family history, past medical history, past social history, past surgical history and problem list.   Objective:   Vitals:   12/20/18 1336  BP: 130/84  Pulse: 98  Weight: 164 lb 3.2 oz (74.5 kg)  Height: 5\' 1"  (1.549 m)    General:  Alert, oriented and cooperative. Patient is in no acute distress.  :   :   :   :   :   :   PE: Well groomed female in no current distress,   Mental Status: Normal mood and affect. Normal behavior. Normal judgment and thought content.   Current BMI: Body mass index is 31.03 kg/m.  waist 37 inches   Assessment and Plan:  Obesity  There are no diagnoses linked to this encounter.  Plan: low carb, High protein diet RX for adipex 37.5 mg daily and B12 1000mcg.ml monthly, to start now with first injection given at today's visit. Reviewed side-effects common to both medications and expected outcomes. Increase daily water intake to at least 8 bottle a day, every day.  Goal is to reduse weight by 10% by end of three months, and will re-evaluate then.  RTC in 4 weeks for Nurse visit to check weight & BP, and get next B12 injections.    Please refer to After Visit Summary for other counseling recommendations.    [place], [place], Corie Vavra N, CNM   Willma Obando [place] Gardens[place] Haider Hornaday, CNM      Consider the Low Glycemic Index Diet and 6 smaller meals daily .  This boosts  your metabolism and regulates your sugars:   Use the protein bar by Atkins because they have lots of fiber in them  Find the low carb flatbreads, tortillas and pita breads for sandwiches:  Joseph's makes a pita bread and a flat bread , available at [employer] and [employer]; Toufayah makes a low carb flatbread available at [employer] and HT that is 9 net carbs and 100 cal [place] makes a low carb whole wheat tortilla available at [employer],and most grocery stores with 6 net carbs and 210 cal  [place] yogurt can still have a lot of carbs .  Dannon Light N fit has 80 cal and 8 carbs

## 2019-01-16 NOTE — Progress Notes (Deleted)
Pt presents for  Weight,B/P, B-12 injection. No side effects of medications-Phentermine or B-12. Weight loss/gain ____ lbs. Encourage eating healthy and exercise. 

## 2019-01-23 ENCOUNTER — Ambulatory Visit (INDEPENDENT_AMBULATORY_CARE_PROVIDER_SITE_OTHER): Payer: Managed Care, Other (non HMO) | Admitting: Obstetrics and Gynecology

## 2019-01-23 VITALS — BP 98/66 | HR 100 | Ht 61.0 in | Wt 164.4 lb

## 2019-01-23 DIAGNOSIS — E6609 Other obesity due to excess calories: Secondary | ICD-10-CM

## 2019-01-23 DIAGNOSIS — Z6831 Body mass index (BMI) 31.0-31.9, adult: Secondary | ICD-10-CM

## 2019-01-23 MED ORDER — CYANOCOBALAMIN 1000 MCG/ML IJ SOLN
1000.0000 ug | Freq: Once | INTRAMUSCULAR | Status: AC
  Administered 2019-01-23: 1000 ug via INTRAMUSCULAR

## 2019-01-23 NOTE — Progress Notes (Signed)
Pt is here for wt, bp check, b-12 inj She is doing well, hasn't been taking her phentermine due to sick child, plans on starting it again   01/23/19 wt- 164.4lb 12/20/18 wt- 164lb  Waist 37 in

## 2019-02-27 ENCOUNTER — Ambulatory Visit (INDEPENDENT_AMBULATORY_CARE_PROVIDER_SITE_OTHER): Payer: Self-pay | Admitting: Obstetrics and Gynecology

## 2019-02-27 ENCOUNTER — Other Ambulatory Visit: Payer: Self-pay

## 2019-02-27 ENCOUNTER — Encounter: Payer: Self-pay | Admitting: Obstetrics and Gynecology

## 2019-02-27 VITALS — BP 102/81 | HR 90 | Ht 61.0 in | Wt 164.4 lb

## 2019-02-27 DIAGNOSIS — E6609 Other obesity due to excess calories: Secondary | ICD-10-CM

## 2019-02-27 DIAGNOSIS — Z6831 Body mass index (BMI) 31.0-31.9, adult: Secondary | ICD-10-CM

## 2019-02-27 MED ORDER — CYANOCOBALAMIN 1000 MCG/ML IJ SOLN
1000.0000 ug | Freq: Once | INTRAMUSCULAR | Status: AC
  Administered 2019-02-27: 1000 ug via INTRAMUSCULAR

## 2019-02-27 MED ORDER — CYANOCOBALAMIN 1000 MCG/ML IJ SOLN: 1000.0000 ug | INTRAMUSCULAR | 1 refills | Status: DC

## 2019-02-27 MED ORDER — PHENTERMINE HCL 37.5 MG PO TABS: 37.5000 mg | ORAL_TABLET | Freq: Every day | ORAL | 2 refills | Status: DC

## 2019-02-27 MED ORDER — DESOGESTREL-ETHINYL ESTRADIOL 0.15-0.02/0.01 MG (21/5) PO TABS: 1.0000 | ORAL_TABLET | Freq: Every day | ORAL | 4 refills | Status: DC

## 2019-02-27 NOTE — Progress Notes (Signed)
PT is here for wt, bp check, b-12 inj She is doing well, she hasnt had her phentermine x 1 month-due to pharmacy mix up, [facility]  02/27/19 wt- 164lb  Waist 34 in

## 2019-03-25 ENCOUNTER — Encounter: Payer: Self-pay | Admitting: *Deleted

## 2019-03-26 ENCOUNTER — Other Ambulatory Visit: Payer: Self-pay

## 2019-03-26 ENCOUNTER — Ambulatory Visit (INDEPENDENT_AMBULATORY_CARE_PROVIDER_SITE_OTHER): Payer: Self-pay | Admitting: Obstetrics and Gynecology

## 2019-03-26 ENCOUNTER — Encounter: Payer: Self-pay | Admitting: Obstetrics and Gynecology

## 2019-03-26 VITALS — Wt 162.0 lb

## 2019-03-26 DIAGNOSIS — Z6830 Body mass index (BMI) 30.0-30.9, adult: Secondary | ICD-10-CM

## 2019-03-26 NOTE — Progress Notes (Signed)
Received transferred call from [facility]. Pt gives 2 identfiers. Patient called in for a televisit for a medication review. Present weight 162 lbs a 2 lb decrease from last  Visit. Pt states she is doing well and is holding off on Vit B12 injections. Call transferred to [place] for review.

## 2019-03-26 NOTE — Progress Notes (Signed)
Virtual Visit via Telephone Note  I connected with Selena White on 03/26/19 at  2:30 PM EDT by telephone and verified that I am speaking with the correct person using two identifiers.   I discussed the limitations, risks, security and privacy concerns of performing an evaluation and management service by telephone and the availability of in person appointments. I also discussed with the patient that there may be a patient responsible charge related to this service. The patient expressed understanding and agreed to proceed.   History of Present Illness: On weight loss medications Denies any concerns   Observations/Objective: Patient reported weight 162 (previously 164)  Assessment and Plan: BMI 30 Medication management  Follow Up Instructions: Will continue on medications at this time. Will have sister (who is an Charity fundraiser) give her the B12 injection. RTC in 4 weeks as planned   I discussed the assessment and treatment plan with the patient. The patient was provided an opportunity to ask questions and all were answered. The patient agreed with the plan and demonstrated an understanding of the instructions.   The patient was advised to call back or seek an in-person evaluation if the symptoms worsen or if the condition fails to improve as anticipated.  I provided 5 minutes of non-face-to-face time during this encounter.   Melody Suzan Nailer, CNM

## 2019-03-27 ENCOUNTER — Ambulatory Visit: Payer: Self-pay

## 2019-04-12 ENCOUNTER — Encounter: Payer: Self-pay | Admitting: *Deleted

## 2019-04-25 ENCOUNTER — Encounter: Payer: BLUE CROSS/BLUE SHIELD | Admitting: Obstetrics and Gynecology

## 2019-11-12 ENCOUNTER — Encounter: Payer: Self-pay | Admitting: Obstetrics and Gynecology

## 2019-12-27 ENCOUNTER — Ambulatory Visit: Payer: MEDICAID | Attending: Internal Medicine

## 2019-12-27 DIAGNOSIS — Z20822 Contact with and (suspected) exposure to covid-19: Secondary | ICD-10-CM

## 2019-12-28 LAB — NOVEL CORONAVIRUS, NAA: SARS-CoV-2, NAA: DETECTED — AB

## 2020-03-05 ENCOUNTER — Encounter: Payer: Self-pay | Admitting: Certified Nurse Midwife

## 2020-04-02 ENCOUNTER — Other Ambulatory Visit: Payer: Self-pay

## 2020-04-02 ENCOUNTER — Encounter: Payer: Self-pay | Admitting: Obstetrics and Gynecology

## 2020-04-02 ENCOUNTER — Ambulatory Visit (INDEPENDENT_AMBULATORY_CARE_PROVIDER_SITE_OTHER): Payer: Managed Care, Other (non HMO) | Admitting: Obstetrics and Gynecology

## 2020-04-02 ENCOUNTER — Other Ambulatory Visit (HOSPITAL_COMMUNITY)
Admission: RE | Admit: 2020-04-02 | Discharge: 2020-04-02 | Disposition: A | Discharge status: Home / Self Care | Payer: Managed Care, Other (non HMO) | Source: Ambulatory Visit | Attending: Obstetrics and Gynecology | Admitting: Obstetrics and Gynecology

## 2020-04-02 VITALS — BP 120/80 | Ht 61.0 in | Wt 151.0 lb

## 2020-04-02 DIAGNOSIS — Z124 Encounter for screening for malignant neoplasm of cervix: Secondary | ICD-10-CM

## 2020-04-02 DIAGNOSIS — Z01419 Encounter for gynecological examination (general) (routine) without abnormal findings: Secondary | ICD-10-CM

## 2020-04-02 DIAGNOSIS — Z113 Encounter for screening for infections with a predominantly sexual mode of transmission: Secondary | ICD-10-CM

## 2020-04-02 DIAGNOSIS — Z1329 Encounter for screening for other suspected endocrine disorder: Secondary | ICD-10-CM

## 2020-04-02 DIAGNOSIS — Z202 Contact with and (suspected) exposure to infections with a predominantly sexual mode of transmission: Secondary | ICD-10-CM

## 2020-04-02 NOTE — Progress Notes (Signed)
PCP:  Purcell Nails, CNM   Chief Complaint  Patient presents with  . Gynecologic Exam    would like thyroid checked     HPI:      Ms. Selena White is a 29 y.o. G1P0000 who LMP was Patient's last menstrual period was 03/12/2020 (approximate)., presents today for her NP> 3 yrs annual examination.  Her menses are regular every 28-30 days, lasting 5 days.  Dysmenorrhea mild, occurring first 1-2 days of flow. She does not have intermenstrual bleeding.  Sex activity: single partner, contraception - condoms. Declines other BC. May want future conception again soon.  Last Pap: Apr 20, 2016  Results were: no abnormalities   There is a FH of breast cancer in her mat grt aunt, genetic testing not indicated. There is no FH of ovarian cancer. The patient does not do self-breast exams.  Tobacco use: The patient denies current or previous tobacco use. Alcohol use: none No drug use.  Exercise: moderately active  She does get adequate calcium but not enough Vitamin D in her diet.  Pt would like thyroid checked due to occas swelling in her neck, occas difficulty swallowing and inability to lose wt (is losing wt on new diet, however). Mom with hypothyroidism.   Pt also stuck with small SQ needle when walking on beach a few wks ago. Current on TdAP 9/19. Would like testing for blood borne path.  Past Medical History:  Diagnosis Date  . Arrhythmia   . Asthma   . Hand tendonitis   . Heavy periods   . Mixed hypoglycemia   . Overweight   . Painful menstrual periods   . UTI (lower urinary tract infection)     History reviewed. No pertinent surgical history.  Family History  Problem Relation Age of Onset  . Heart disease Father   . Diabetes Father   . Hypertension Father   . Cancer Maternal Grandmother        breast (mgm sister who has passed)  . Diabetes Maternal Grandmother   . Hypothyroidism Mother     Social History   Socioeconomic History  . Marital status: Married   Spouse name: Not on file  . Number of children: Not on file  . Years of education: Not on file  . Highest education level: Not on file  Occupational History    Employer: [employer]  Tobacco Use  . Smoking status: Never Smoker  . Smokeless tobacco: Never Used  Substance and Sexual Activity  . Alcohol use: No    Alcohol/week: 0.0 standard drinks  . Drug use: No  . Sexual activity: Yes    Partners: Male    Birth control/protection: None, Condom  Other Topics Concern  . Not on file  Social History Narrative  . Not on file   Social Determinants of Health   Financial Resource Strain:   . Difficulty of Paying Living Expenses:   Food Insecurity:   . Worried About Programme researcher, broadcasting/film/video in the Last Year:   . Barista in the Last Year:   Transportation Needs:   . Freight forwarder (Medical):   Marland Kitchen Lack of Transportation (Non-Medical):   Physical Activity:   . Days of Exercise per Week:   . Minutes of Exercise per Session:   Stress:   . Feeling of Stress :   Social Connections:   . Frequency of Communication with Friends and Family:   . Frequency of Social Gatherings with  Friends and Family:   . Attends Religious Services:   . Active Member of Clubs or Organizations:   . Attends Banker Meetings:   Marland Kitchen Marital Status:   Intimate Partner Violence:   . Fear of Current or Ex-Partner:   . Emotionally Abused:   Marland Kitchen Physically Abused:   . Sexually Abused:     No current outpatient medications on file.     ROS:  Review of Systems  Constitutional: Negative for fatigue, fever and unexpected weight change.  Respiratory: Negative for cough, shortness of breath and wheezing.   Cardiovascular: Negative for chest pain, palpitations and leg swelling.  Gastrointestinal: Negative for blood in stool, constipation, diarrhea, nausea and vomiting.  Endocrine: Negative for cold intolerance, heat intolerance and polyuria.  Genitourinary: Negative for  dyspareunia, dysuria, flank pain, frequency, genital sores, hematuria, menstrual problem, pelvic pain, urgency, vaginal bleeding, vaginal discharge and vaginal pain.  Musculoskeletal: Positive for arthralgias. Negative for back pain, joint swelling and myalgias.  Skin: Negative for rash.  Neurological: Positive for headaches. Negative for dizziness, syncope, light-headedness and numbness.  Hematological: Negative for adenopathy.  Psychiatric/Behavioral: Negative for agitation, confusion, sleep disturbance and suicidal ideas. The patient is not nervous/anxious.   BREAST: No symptoms   Objective: BP 120/80   Ht 5\' 1"  (1.549 m)   Wt 151 lb (68.5 kg)   LMP 03/12/2020 (Approximate)   BMI 28.53 kg/m    Physical Exam Constitutional:      Appearance: She is well-developed.  Genitourinary:     Vulva, vagina, cervix, uterus, right adnexa and left adnexa normal.     No vulval lesion or tenderness noted.     No vaginal discharge, erythema or tenderness.     No cervical polyp.     Uterus is not enlarged or tender.     No right or left adnexal mass present.     Right adnexa not tender.     Left adnexa not tender.  Neck:     Thyroid: No thyromegaly.  Cardiovascular:     Rate and Rhythm: Normal rate and regular rhythm.     Heart sounds: Normal heart sounds. No murmur.  Pulmonary:     Effort: Pulmonary effort is normal.     Breath sounds: Normal breath sounds.  Chest:     Breasts:        Right: No mass, nipple discharge, skin change or tenderness.        Left: No mass, nipple discharge, skin change or tenderness.  Abdominal:     Palpations: Abdomen is soft.     Tenderness: There is no abdominal tenderness. There is no guarding.  Musculoskeletal:        General: Normal range of motion.     Cervical back: Normal range of motion.  Neurological:     General: No focal deficit present.     Mental Status: She is alert and oriented to person, place, and time.     Cranial Nerves: No cranial  nerve deficit.  Skin:    General: Skin is warm and dry.  Psychiatric:        Mood and Affect: Mood normal.        Behavior: Behavior normal.        Thought Content: Thought content normal.        Judgment: Judgment normal.  Vitals reviewed.     Assessment/Plan: Encounter for annual routine gynecological examination  Cervical cancer screening  Thyroid disorder screening - Plan: TSH + free T4;  Will f/u with results if pos. If neg, cont to follow dysphagia and may need to see ENT. Neck swelling could be LAN.   Screening for STD (sexually transmitted disease) - Plan: HIV Antibody (routine testing w rflx), Hepatitis C antibody, Hepatitis B surface antibody,qualitative; Needle stick when walking on beach. If neg, can recheck HIV in 6 months. Hopefully, needle "clean" given it was in sand and sun.  Possible exposure to STD - Plan: HIV Antibody (routine testing w rflx), Hepatitis C antibody, Hepatitis B surface antibody,qualitative            GYN counsel adequate intake of calcium and vitamin D, diet and exercise     F/U  Return in about 1 year (around 04/02/2021).  Selena White B. Laterrica Libman, PA-C 04/02/2020 1:58 PM

## 2020-04-02 NOTE — Patient Instructions (Signed)
I value your feedback and entrusting us with your care. If you get a [facility] patient survey, I would appreciate you taking the time to let us know about your experience today. Thank you!  As of November 21, 2019, your lab results will be released to your MyChart immediately, before I even have a chance to see them. Please give me time to review them and contact you if there are any abnormalities. Thank you for your patience.  

## 2020-04-02 NOTE — Addendum Note (Signed)
Addended by: Althea Grimmer B on: 04/02/2020 02:02 PM   Modules accepted: Orders

## 2020-04-03 LAB — HIV ANTIBODY (ROUTINE TESTING W REFLEX): HIV Screen 4th Generation wRfx: NONREACTIVE

## 2020-04-03 LAB — TSH+FREE T4
Free T4: 1.58 ng/dL (ref 0.82–1.77)
TSH: 2.09 u[IU]/mL (ref 0.450–4.500)

## 2020-04-03 LAB — HEPATITIS C ANTIBODY: Hep C Virus Ab: <0.1 s/co ratio (ref 0.0–0.9)

## 2020-04-03 LAB — HEPATITIS B SURFACE ANTIBODY,QUALITATIVE: Hep B Surface Ab, Qual: REACTIVE

## 2020-04-06 LAB — CYTOLOGY - PAP: Diagnosis: NEGATIVE

## 2020-04-07 ENCOUNTER — Encounter: Payer: Self-pay | Admitting: Obstetrics and Gynecology

## 2020-06-24 ENCOUNTER — Encounter: Payer: Self-pay | Admitting: Certified Nurse Midwife

## 2020-06-24 ENCOUNTER — Ambulatory Visit (INDEPENDENT_AMBULATORY_CARE_PROVIDER_SITE_OTHER): Payer: 59 | Admitting: Certified Nurse Midwife

## 2020-06-24 ENCOUNTER — Other Ambulatory Visit: Payer: Self-pay

## 2020-06-24 VITALS — BP 99/59 | HR 77 | Ht 61.0 in | Wt 143.2 lb

## 2020-06-24 DIAGNOSIS — N926 Irregular menstruation, unspecified: Secondary | ICD-10-CM

## 2020-06-24 LAB — POCT URINE PREGNANCY: Preg Test, Ur: POSITIVE — AB

## 2020-06-24 NOTE — Progress Notes (Signed)
Subjective:    Selena White is a 29 y.o. female who presents for evaluation of amenorrhea. She believes she could be pregnant. Pregnancy is desired. Sexual Activity: single partner, contraception: none. Current symptoms also include: nausea. Last period was normal.   Patient's last menstrual period was 05/13/2020 (exact date). The following portions of the patient's history were reviewed and updated as appropriate: allergies, current medications, past family history, past medical history, past social history, past surgical history and problem list.  Review of Systems Pertinent items are noted in HPI.     Objective:    BP (!) 99/59   Pulse 77   Ht 5\' 1"  (1.549 m)   Wt 143 lb 3 oz (64.9 kg)   LMP 05/13/2020 (Exact Date)   BMI 27.06 kg/m  General: alert, cooperative, appears stated age and no acute distress    Lab Review Urine HCG: positive    Assessment:    Absence of menstruation.     Plan:  Positive: EDC: .02/17/21 Briefly discussed pre-natal care options. Md vs midwifery care reviewed. Pt plans to see midwives. Encouraged well-balanced diet, plenty of rest when needed, pre-natal vitamins daily and walking for exercise. Discussed self-help for nausea, avoiding OTC medications until consulting provider or pharmacist, other than Tylenol as needed, minimal caffeine (1-2 cups daily) and avoiding alcohol. She will schedule her u/s for dating in the next week, niurse visit @ 10 wks ant  initial NOB @ 12 wks .  Feel free to call with any questions.   04/19/21, CNM

## 2020-06-24 NOTE — Patient Instructions (Signed)

## 2020-06-29 ENCOUNTER — Telehealth: Payer: Self-pay

## 2020-06-29 MED ORDER — DOXYLAMINE-PYRIDOXINE 10-10 MG PO TBEC: 2.0000 | DELAYED_RELEASE_TABLET | Freq: Every day | ORAL | 5 refills | Status: DC

## 2020-06-29 NOTE — Telephone Encounter (Signed)
diclegis script sent to preferred pharmacy. mychart message sent to patient

## 2020-06-30 ENCOUNTER — Telehealth: Payer: Self-pay

## 2020-06-30 NOTE — Telephone Encounter (Signed)
Pt called in and stated that she has been talking back and forward with the nurse. The pt is having nausea, the pt said she she had Dicelgis sent to her  Pharmacy [facility] on [address]. The pt said that the medication is $300. The pt is requesting something cheaper sent in for her. Please advise

## 2020-06-30 NOTE — Telephone Encounter (Signed)
mychart message sent to patient

## 2020-07-01 ENCOUNTER — Other Ambulatory Visit: Payer: Self-pay

## 2020-07-01 ENCOUNTER — Ambulatory Visit (INDEPENDENT_AMBULATORY_CARE_PROVIDER_SITE_OTHER): Payer: 59

## 2020-07-01 DIAGNOSIS — N926 Irregular menstruation, unspecified: Secondary | ICD-10-CM

## 2020-07-01 DIAGNOSIS — Z3A01 Less than 8 weeks gestation of pregnancy: Secondary | ICD-10-CM | POA: Diagnosis not present

## 2020-07-13 ENCOUNTER — Telehealth: Payer: Self-pay

## 2020-07-13 NOTE — Telephone Encounter (Signed)
mychart message sent to patient

## 2020-07-16 ENCOUNTER — Ambulatory Visit (INDEPENDENT_AMBULATORY_CARE_PROVIDER_SITE_OTHER): Payer: 59 | Admitting: Certified Nurse Midwife

## 2020-07-16 VITALS — BP 102/66 | HR 83

## 2020-07-16 DIAGNOSIS — R300 Dysuria: Secondary | ICD-10-CM | POA: Diagnosis not present

## 2020-07-16 DIAGNOSIS — O26891 Other specified pregnancy related conditions, first trimester: Secondary | ICD-10-CM | POA: Diagnosis not present

## 2020-07-16 LAB — POCT URINALYSIS DIPSTICK OB
Bilirubin, UA: NEGATIVE
Blood, UA: NEGATIVE
Glucose, UA: NEGATIVE
Ketones, UA: NEGATIVE
Leukocytes, UA: NEGATIVE
Nitrite, UA: NEGATIVE
POC,PROTEIN,UA: NEGATIVE
Spec Grav, UA: 1.01 (ref 1.010–1.025)
Urobilinogen, UA: 0.2 E.U./dL
pH, UA: 5 (ref 5.0–8.0)

## 2020-07-16 NOTE — Progress Notes (Signed)
Patient presents for a urine drop off for c/o lower abdominal discomfort after urinating. Urine dip was negative. Urine culture was sent to lab. Patient was encouraged to increase water intake and seek emergent care if she develops a fever or worsening symptoms. Patient was in agreement.

## 2020-07-18 LAB — URINE CULTURE: Organism ID, Bacteria: NO GROWTH

## 2020-07-23 ENCOUNTER — Encounter: Payer: Self-pay | Admitting: Certified Nurse Midwife

## 2020-07-23 ENCOUNTER — Ambulatory Visit (INDEPENDENT_AMBULATORY_CARE_PROVIDER_SITE_OTHER): Payer: 59

## 2020-07-23 VITALS — BP 115/78 | HR 73 | Ht 61.0 in | Wt 141.0 lb

## 2020-07-23 DIAGNOSIS — Z113 Encounter for screening for infections with a predominantly sexual mode of transmission: Secondary | ICD-10-CM | POA: Diagnosis not present

## 2020-07-23 DIAGNOSIS — Z3A10 10 weeks gestation of pregnancy: Secondary | ICD-10-CM | POA: Diagnosis not present

## 2020-07-23 DIAGNOSIS — Z0283 Encounter for blood-alcohol and blood-drug test: Secondary | ICD-10-CM | POA: Diagnosis not present

## 2020-07-23 DIAGNOSIS — Z3481 Encounter for supervision of other normal pregnancy, first trimester: Secondary | ICD-10-CM | POA: Diagnosis not present

## 2020-07-23 LAB — OB RESULTS CONSOLE VARICELLA ZOSTER ANTIBODY, IGG: Varicella: IMMUNE

## 2020-07-23 NOTE — Progress Notes (Signed)
      Selena White presents for NOB nurse intake visit. Pregnancy confirmation done at [facility], 06/24/2020, with Doreene Burke.  G 2.  P 1001.  LMP 05/13/2020.  EDD 02/18/2020.  Ga [redacted]w[redacted]d. Pregnancy education material explained and given.  2 cats in the home.  NOB labs ordered. BMI less than 30. TSH/HbgA1c. Sickle cell not ordered due to race. HIV and drug screen explained and ordered. Genetic screening discussed. Genetic testing; Ordered N[facility]a Completed 07/13/2020. Pt to discuss genetic testing with provider. PNV encouraged. Pt to follow up with provider in 2 weeks for NOB physical.  [facility] [employer], HIV/Drug Screening Form and FMLA forms signed and completed by pt.

## 2020-07-23 NOTE — Patient Instructions (Signed)
WHAT OB PATIENTS CAN EXPECT   Confirmation of pregnancy and ultrasound ordered if medically indicated-[redacted] weeks gestation  New OB (NOB) intake with nurse and New OB (NOB) labs- [redacted] weeks gestation  New OB (NOB) physical examination with provider- 11/[redacted] weeks gestation  Flu vaccine-[redacted] weeks gestation  Anatomy scan-[redacted] weeks gestation  Glucose tolerance test, blood work to test for anemia, T-dap vaccine-[redacted] weeks gestation  Vaginal swabs/cultures-STD/Group B strep-[redacted] weeks gestation  Appointments every 4 weeks until 28 weeks  Every 2 weeks from 28 weeks until 36 weeks  Weekly visits from 36 weeks until delivery  Second Trimester of Pregnancy  The second trimester is from week 14 through week 27 (month 4 through 6). This is often the time in pregnancy that you feel your best. Often times, morning sickness has lessened or quit. You may have more energy, and you may get hungry more often. Your unborn baby is growing rapidly. At the end of the sixth month, he or she is about 9 inches long and weighs about 1 pounds. You will likely feel the baby move between 18 and 20 weeks of pregnancy. Follow these instructions at home: Medicines  Take over-the-counter and prescription medicines only as told by your doctor. Some medicines are safe and some medicines are not safe during pregnancy.  Take a prenatal vitamin that contains at least 600 micrograms (mcg) of folic acid.  If you have trouble pooping (constipation), take medicine that will make your stool soft (stool softener) if your doctor approves. Eating and drinking   Eat regular, healthy meals.  Avoid raw meat and uncooked cheese.  If you get low calcium from the food you eat, talk to your doctor about taking a daily calcium supplement.  Avoid foods that are high in fat and sugars, such as fried and sweet foods.  If you feel sick to your stomach (nauseous) or throw up (vomit): ? Eat 4 or 5 small meals a day instead of 3 large  meals. ? Try eating a few soda crackers. ? Drink liquids between meals instead of during meals.  To prevent constipation: ? Eat foods that are high in fiber, like fresh fruits and vegetables, whole grains, and beans. ? Drink enough fluids to keep your pee (urine) clear or pale yellow. Activity  Exercise only as told by your doctor. Stop exercising if you start to have cramps.  Do not exercise if it is too hot, too humid, or if you are in a place of great height (high altitude).  Avoid heavy lifting.  Wear low-heeled shoes. Sit and stand up straight.  You can continue to have sex unless your doctor tells you not to. Relieving pain and discomfort  Wear a good support bra if your breasts are tender.  Take warm water baths (sitz baths) to soothe pain or discomfort caused by hemorrhoids. Use hemorrhoid cream if your doctor approves.  Rest with your legs raised if you have leg cramps or low back pain.  If you develop puffy, bulging veins (varicose veins) in your legs: ? Wear support hose or compression stockings as told by your doctor. ? Raise (elevate) your feet for 15 minutes, 3-4 times a day. ? Limit salt in your food. Prenatal care  Write down your questions. Take them to your prenatal visits.  Keep all your prenatal visits as told by your doctor. This is important. Safety  Wear your seat belt when driving.  Make a list of emergency phone numbers, including numbers for family, friends, the  hospital, and police and fire departments. General instructions  Ask your doctor about the right foods to eat or for help finding a counselor, if you need these services.  Ask your doctor about local prenatal classes. Begin classes before month 6 of your pregnancy.  Do not use hot tubs, steam rooms, or saunas.  Do not douche or use tampons or scented sanitary pads.  Do not cross your legs for long periods of time.  Visit your dentist if you have not done so. Use a soft toothbrush  to brush your teeth. Floss gently.  Avoid all smoking, herbs, and alcohol. Avoid drugs that are not approved by your doctor.  Do not use any products that contain nicotine or tobacco, such as cigarettes and e-cigarettes. If you need help quitting, ask your doctor.  Avoid cat litter boxes and soil used by cats. These carry germs that can cause birth defects in the baby and can cause a loss of your baby (miscarriage) or stillbirth. Contact a doctor if:  You have mild cramps or pressure in your lower belly.  You have pain when you pee (urinate).  You have bad smelling fluid coming from your vagina.  You continue to feel sick to your stomach (nauseous), throw up (vomit), or have watery poop (diarrhea).  You have a nagging pain in your belly area.  You feel dizzy. Get help right away if:  You have a fever.  You are leaking fluid from your vagina.  You have spotting or bleeding from your vagina.  You have severe belly cramping or pain.  You lose or gain weight rapidly.  You have trouble catching your breath and have chest pain.  You notice sudden or extreme puffiness (swelling) of your face, hands, ankles, feet, or legs.  You have not felt the baby move in over an hour.  You have severe headaches that do not go away when you take medicine.  You have trouble seeing. Summary  The second trimester is from week 14 through week 27 (months 4 through 6). This is often the time in pregnancy that you feel your best.  To take care of yourself and your unborn baby, you will need to eat healthy meals, take medicines only if your doctor tells you to do so, and do activities that are safe for you and your baby.  Call your doctor if you get sick or if you notice anything unusual about your pregnancy. Also, call your doctor if you need help with the right food to eat, or if you want to know what activities are safe for you. This information is not intended to replace advice given to you by  your health care provider. Make sure you discuss any questions you have with your health care provider. Document Revised: 03/22/2019 Document Reviewed: 01/03/2017 Elsevier Patient Education  [place]. Morning Sickness  Morning sickness is when you feel sick to your stomach (nauseous) during pregnancy. You may feel sick to your stomach and throw up (vomit). You may feel sick in the morning, but you can feel this way at any time of day. Some women feel very sick to their stomach and cannot stop throwing up (hyperemesis gravidarum). Follow these instructions at home: Medicines  Take over-the-counter and prescription medicines only as told by your doctor. Do not take any medicines until you talk with your doctor about them first.  Taking multivitamins before getting pregnant can stop or lessen the harshness of morning sickness. Eating and drinking  Eat  dry toast or crackers before getting out of bed.  Eat 5 or 6 small meals a day.  Eat dry and bland foods like rice and baked potatoes.  Do not eat greasy, fatty, or spicy foods.  Have someone cook for you if the smell of food causes you to feel sick or throw up.  If you feel sick to your stomach after taking prenatal vitamins, take them at night or with a snack.  Eat protein when you need a snack. Nuts, yogurt, and cheese are good choices.  Drink fluids throughout the day.  Try ginger ale made with real ginger, ginger tea made from fresh grated ginger, or ginger candies. General instructions  Do not use any products that have nicotine or tobacco in them, such as cigarettes and e-cigarettes. If you need help quitting, ask your doctor.  Use an air purifier to keep the air in your house free of smells.  Get lots of fresh air.  Try to avoid smells that make you feel sick.  Try: ? Wearing a bracelet that is used for seasickness (acupressure wristband). ? Going to a doctor who puts thin needles into certain body points  (acupuncture) to improve how you feel. Contact a doctor if:  You need medicine to feel better.  You feel dizzy or light-headed.  You are losing weight. Get help right away if:  You feel very sick to your stomach and cannot stop throwing up.  You pass out (faint).  You have very bad pain in your belly. Summary  Morning sickness is when you feel sick to your stomach (nauseous) during pregnancy.  You may feel sick in the morning, but you can feel this way at any time of day.  Making some changes to what you eat may help your symptoms go away. This information is not intended to replace advice given to you by your health care provider. Make sure you discuss any questions you have with your health care provider. Document Revised: 11/10/2017 Document Reviewed: 12/29/2016 Elsevier Patient Education  2020 [employer]. How a Baby Grows During Pregnancy  Pregnancy begins when a female's sperm enters a female's egg (fertilization). Fertilization usually happens in one of the tubes (fallopian tubes) that connect the ovaries to the womb (uterus). The fertilized egg moves down the fallopian tube to the uterus. Once it reaches the uterus, it implants into the lining of the uterus and begins to grow. For the first 10 weeks, the fertilized egg is called an embryo. After 10 weeks, it is called a fetus. As the fetus continues to grow, it receives oxygen and nutrients through tissue (placenta) that grows to support the developing baby. The placenta is the life support system for the baby. It provides oxygen and nutrition and removes waste. Learning as much as you can about your pregnancy and how your baby is developing can help you enjoy the experience. It can also make you aware of when there might be a problem and when to ask questions. How long does a typical pregnancy last? A pregnancy usually lasts 280 days, or about 40 weeks. Pregnancy is divided into three periods of growth, also called  trimesters:  First trimester: 0-12 weeks.  Second trimester: 13-27 weeks.  Third trimester: 28-40 weeks. The day when your baby is ready to be born (full term) is your estimated date of delivery. How does my baby develop month by month? First month  The fertilized egg attaches to the inside of the uterus.  Some cells  will form the placenta. Others will form the fetus.  The arms, legs, brain, spinal cord, lungs, and heart begin to develop.  At the end of the first month, the heart begins to beat. Second month  The bones, inner ear, eyelids, hands, and feet form.  The genitals develop.  By the end of 8 weeks, all major organs are developing. Third month  All of the internal organs are forming.  Teeth develop below the gums.  Bones and muscles begin to grow. The spine can flex.  The skin is transparent.  Fingernails and toenails begin to form.  Arms and legs continue to grow longer, and hands and feet develop.  The fetus is about 3 inches (7.6 cm) long. Fourth month  The placenta is completely formed.  The external sex organs, neck, outer ear, eyebrows, eyelids, and fingernails are formed.  The fetus can hear, swallow, and move its arms and legs.  The kidneys begin to produce urine.  The skin is covered with a white, waxy coating (vernix) and very fine hair (lanugo). Fifth month  The fetus moves around more and can be felt for the first time (quickening).  The fetus starts to sleep and wake up and may begin to suck its finger.  The nails grow to the end of the fingers.  The organ in the digestive system that makes bile (gallbladder) functions and helps to digest nutrients.  If your baby is a girl, eggs are present in her ovaries. If your baby is a boy, testicles start to move down into his scrotum. Sixth month  The lungs are formed.  The eyes open. The brain continues to develop.  Your baby has fingerprints and toe prints. Your baby's hair grows  thicker.  At the end of the second trimester, the fetus is about 9 inches (22.9 cm) long. Seventh month  The fetus kicks and stretches.  The eyes are developed enough to sense changes in light.  The hands can make a grasping motion.  The fetus responds to sound. Eighth month  All organs and body systems are fully developed and functioning.  Bones harden, and taste buds develop. The fetus may hiccup.  Certain areas of the brain are still developing. The skull remains soft. Ninth month  The fetus gains about  lb (0.23 kg) each week.  The lungs are fully developed.  Patterns of sleep develop.  The fetus's head typically moves into a head-down position (vertex) in the uterus to prepare for birth.  The fetus weighs 6-9 lb (2.72-4.08 kg) and is 19-20 inches (48.26-50.8 cm) long. What can I do to have a healthy pregnancy and help my baby develop? General instructions  Take prenatal vitamins as directed by your health care provider. These include vitamins such as folic acid, iron, calcium, and vitamin D. They are important for healthy development.  Take medicines only as directed by your health care provider. Read labels and ask a pharmacist or your health care provider whether over-the-counter medicines, supplements, and prescription drugs are safe to take during pregnancy.  Keep all follow-up visits as directed by your health care provider. This is important. Follow-up visits include prenatal care and screening tests. How do I know if my baby is developing well? At each prenatal visit, your health care provider will do several different tests to check on your health and keep track of your baby's development. These include:  Fundal height and position. ? Your health care provider will measure your growing belly from your  pubic bone to the top of the uterus using a tape measure. ? Your health care provider will also feel your belly to determine your baby's  position.  Heartbeat. ? An ultrasound in the first trimester can confirm pregnancy and show a heartbeat, depending on how far along you are. ? Your health care provider will check your baby's heart rate at every prenatal visit.  Second trimester ultrasound. ? This ultrasound checks your baby's development. It also may show your baby's gender. What should I do if I have concerns about my baby's development? Always talk with your health care provider about any concerns that you may have about your pregnancy and your baby. Summary  A pregnancy usually lasts 280 days, or about 40 weeks. Pregnancy is divided into three periods of growth, also called trimesters.  Your health care provider will monitor your baby's growth and development throughout your pregnancy.  Follow your health care provider's recommendations about taking prenatal vitamins and medicines during your pregnancy.  Talk with your health care provider if you have any concerns about your pregnancy or your developing baby. This information is not intended to replace advice given to you by your health care provider. Make sure you discuss any questions you have with your health care provider. Document Revised: 03/21/2019 Document Reviewed: 10/11/2017 Elsevier Patient Education  2020 [place] of Pregnancy  The first trimester of pregnancy is from week 1 until the end of week 13 (months 1 through 3). During this time, your baby will begin to develop inside you. At 6-8 weeks, the eyes and face are formed, and the heartbeat can be seen on ultrasound. At the end of 12 weeks, all the baby's organs are formed. Prenatal care is all the medical care you receive before the birth of your baby. Make sure you get good prenatal care and follow all of your doctor's instructions. Follow these instructions at home: Medicines  Take over-the-counter and prescription medicines only as told by your doctor. Some medicines are safe  and some medicines are not safe during pregnancy.  Take a prenatal vitamin that contains at least 600 micrograms (mcg) of folic acid.  If you have trouble pooping (constipation), take medicine that will make your stool soft (stool softener) if your doctor approves. Eating and drinking   Eat regular, healthy meals.  Your doctor will tell you the amount of weight gain that is right for you.  Avoid raw meat and uncooked cheese.  If you feel sick to your stomach (nauseous) or throw up (vomit): ? Eat 4 or 5 small meals a day instead of 3 large meals. ? Try eating a few soda crackers. ? Drink liquids between meals instead of during meals.  To prevent constipation: ? Eat foods that are high in fiber, like fresh fruits and vegetables, whole grains, and beans. ? Drink enough fluids to keep your pee (urine) clear or pale yellow. Activity  Exercise only as told by your doctor. Stop exercising if you have cramps or pain in your lower belly (abdomen) or low back.  Do not exercise if it is too hot, too humid, or if you are in a place of great height (high altitude).  Try to avoid standing for long periods of time. Move your legs often if you must stand in one place for a long time.  Avoid heavy lifting.  Wear low-heeled shoes. Sit and stand up straight.  You can have sex unless your doctor tells you not to. Relieving pain  and discomfort  Wear a good support bra if your breasts are sore.  Take warm water baths (sitz baths) to soothe pain or discomfort caused by hemorrhoids. Use hemorrhoid cream if your doctor says it is okay.  Rest with your legs raised if you have leg cramps or low back pain.  If you have puffy, bulging veins (varicose veins) in your legs: ? Wear support hose or compression stockings as told by your doctor. ? Raise (elevate) your feet for 15 minutes, 3-4 times a day. ? Limit salt in your food. Prenatal care  Schedule your prenatal visits by the twelfth week of  pregnancy.  Write down your questions. Take them to your prenatal visits.  Keep all your prenatal visits as told by your doctor. This is important. Safety  Wear your seat belt at all times when driving.  Make a list of emergency phone numbers. The list should include numbers for family, friends, the hospital, and police and fire departments. General instructions  Ask your doctor for a referral to a local prenatal class. Begin classes no later than at the start of month 6 of your pregnancy.  Ask for help if you need counseling or if you need help with nutrition. Your doctor can give you advice or tell you where to go for help.  Do not use hot tubs, steam rooms, or saunas.  Do not douche or use tampons or scented sanitary pads.  Do not cross your legs for long periods of time.  Avoid all herbs and alcohol. Avoid drugs that are not approved by your doctor.  Do not use any tobacco products, including cigarettes, chewing tobacco, and electronic cigarettes. If you need help quitting, ask your doctor. You may get counseling or other support to help you quit.  Avoid cat litter boxes and soil used by cats. These carry germs that can cause birth defects in the baby and can cause a loss of your baby (miscarriage) or stillbirth.  Visit your dentist. At home, brush your teeth with a soft toothbrush. Be gentle when you floss. Contact a doctor if:  You are dizzy.  You have mild cramps or pressure in your lower belly.  You have a nagging pain in your belly area.  You continue to feel sick to your stomach, you throw up, or you have watery poop (diarrhea).  You have a bad smelling fluid coming from your vagina.  You have pain when you pee (urinate).  You have increased puffiness (swelling) in your face, hands, legs, or ankles. Get help right away if:  You have a fever.  You are leaking fluid from your vagina.  You have spotting or bleeding from your vagina.  You have very bad belly  cramping or pain.  You gain or lose weight rapidly.  You throw up blood. It may look like coffee grounds.  You are around people who have [place] measles, fifth disease, or chickenpox.  You have a very bad headache.  You have shortness of breath.  You have any kind of trauma, such as from a fall or a car accident. Summary  The first trimester of pregnancy is from week 1 until the end of week 13 (months 1 through 3).  To take care of yourself and your unborn baby, you will need to eat healthy meals, take medicines only if your doctor tells you to do so, and do activities that are safe for you and your baby.  Keep all follow-up visits as told by your  doctor. This is important as your doctor will have to ensure that your baby is healthy and growing well. This information is not intended to replace advice given to you by your health care provider. Make sure you discuss any questions you have with your health care provider. Document Revised: 03/21/2019 Document Reviewed: 12/06/2016 Elsevier Patient Education  2020 [employer]. Commonly Asked Questions During Pregnancy  Cats: A parasite can be excreted in cat feces.  To avoid exposure you need to have another person empty the little box.  If you must empty the litter box you will need to wear gloves.  Wash your hands after handling your cat.  This parasite can also be found in raw or undercooked meat so this should also be avoided.  Colds, Sore Throats, Flu: Please check your medication sheet to see what you can take for symptoms.  If your symptoms are unrelieved by these medications please call the office.  Dental Work: Most any dental work [employer] recommends is permitted.  X-rays should only be taken during the first trimester if absolutely necessary.  Your abdomen should be shielded with a lead apron during all x-rays.  Please notify your provider prior to receiving any x-rays.  Novocaine is fine; gas is not recommended.  If your  dentist requires a note from [place] prior to dental work please call the office and we will provide one for you.  Exercise: Exercise is an important part of staying healthy during your pregnancy.  You may continue most exercises you were accustomed to prior to pregnancy.  Later in your pregnancy you will most likely notice you have difficulty with activities requiring balance like riding a bicycle.  It is important that you listen to your body and avoid activities that put you at a higher risk of falling.  Adequate rest and staying well hydrated are a must!  If you have questions about the safety of specific activities ask your provider.    Exposure to Children with illness: Try to avoid obvious exposure; report any symptoms to [place] when noted,  If you have chicken pos, red measles or mumps, you should be immune to these diseases.   Please do not take any vaccines while pregnant unless you have checked with your OB provider.  Fetal Movement: After 28 weeks we recommend you do "kick counts" twice daily.  Lie or sit down in a calm quiet environment and count your baby movements "kicks".  You should feel your baby at least 10 times per hour.  If you have not felt 10 kicks within the first hour get up, walk around and have something sweet to eat or drink then repeat for an additional hour.  If count remains less than 10 per hour notify your provider.  Fumigating: Follow your pest control agent's advice as to how long to stay out of your home.  Ventilate the area well before re-entering.  Hemorrhoids:   Most over-the-counter preparations can be used during pregnancy.  Check your medication to see what is safe to use.  It is important to use a stool softener or fiber in your diet and to drink lots of liquids.  If hemorrhoids seem to be getting worse please call the office.   Hot Tubs:  Hot tubs Jacuzzis and saunas are not recommended while pregnant.  These increase your internal body temperature and should be  avoided.  Intercourse:  Sexual intercourse is safe during pregnancy as long as you are comfortable, unless otherwise advised by  your provider.  Spotting may occur after intercourse; report any bright red bleeding that is heavier than spotting.  Labor:  If you know that you are in labor, please go to the hospital.  If you are unsure, please call the office and let us help you decide what to do.  Lifting, straining, etc:  If your job requires heavy lifting or straining please check with your provider for any limitations.  Generally, you should not lift items heavier than that you can lift simply with your hands and arms (no back muscles)  Painting:  Paint fumes do not harm your pregnancy, but may make you ill and should be avoided if possible.  Latex or water based paints have less odor than oils.  Use adequate ventilation while painting.  Permanents & Hair Color:  Chemicals in hair dyes are not recommended as they cause increase hair dryness which can increase hair loss during pregnancy.  " Highlighting" and permanents are allowed.  Dye may be absorbed differently and permanents may not hold as well during pregnancy.  Sunbathing:  Use a sunscreen, as skin burns easily during pregnancy.  Drink plenty of fluids; avoid over heating.  Tanning Beds:  Because their possible side effects are still unknown, tanning beds are not recommended.  Ultrasound Scans:  Routine ultrasounds are performed at approximately 20 weeks.  You will be able to see your baby's general anatomy an if you would like to know the gender this can usually be determined as well.  If it is questionable when you conceived you may also receive an ultrasound early in your pregnancy for dating purposes.  Otherwise ultrasound exams are not routinely performed unless there is a medical necessity.  Although you can request a scan we ask that you pay for it when conducted because insurance does not cover " patient request" scans.  Work: If your  pregnancy proceeds without complications you may work until your due date, unless your physician or employer advises otherwise.  Round Ligament Pain/Pelvic Discomfort:  Sharp, shooting pains not associated with bleeding are fairly common, usually occurring in the second trimester of pregnancy.  They tend to be worse when standing up or when you remain standing for long periods of time.  These are the result of pressure of certain pelvic ligaments called "round ligaments".  Rest, Tylenol and heat seem to be the most effective relief.  As the womb and fetus grow, they rise out of the pelvis and the discomfort improves.  Please notify the office if your pain seems different than that described.  It may represent a more serious condition.  Common Medications Safe in Pregnancy  Acne:      Constipation:  Benzoyl Peroxide     Colace  Clindamycin      Dulcolax Suppository  Topica Erythromycin     Fibercon  Salicylic Acid      Metamucil         Miralax AVOID:        Senakot   Accutane    Cough:  Retin-A       Cough Drops  Tetracycline      Phenergan w/ Codeine if Rx  Minocycline      Robitussin (Plain & DM)  Antibiotics:     Crabs/Lice:  Ceclor       RID  Cephalosporins    AVOID:  E-Mycins      Kwell  Keflex  Macrobid/Macrodantin   Diarrhea:  Penicillin      Kao-Pectate  Zithromax  Imodium AD         PUSH FLUIDS AVOID:       Cipro     Fever:  Tetracycline      Tylenol (Regular or Extra  Minocycline       Strength)  Levaquin      Extra Strength-Do not          Exceed 8 tabs/24 hrs Caffeine:        <268m/day (equiv. To 1 cup of coffee or  approx. 3 12 oz sodas)         Gas: Cold/Hayfever:       Gas-X  Benadryl      Mylicon  Claritin       Phazyme  **Claritin-D        Chlor-Trimeton    Headaches:  Dimetapp      ASA-Free Excedrin  Drixoral-Non-Drowsy     Cold Compress  Mucinex (Guaifenasin)     Tylenol (Regular or Extra  Sudafed/Sudafed-12 Hour     Strength)  **Sudafed PE  Pseudoephedrine   Tylenol Cold & Sinus     Vicks Vapor Rub  Zyrtec  **AVOID if Problems With Blood Pressure         Heartburn: Avoid lying down for at least 1 hour after meals  Aciphex      Maalox     Rash:  Milk of Magnesia     Benadryl    Mylanta       1% Hydrocortisone Cream  Pepcid  Pepcid Complete   Sleep Aids:  Prevacid      Ambien   Prilosec       Benadryl  Rolaids       Chamomile Tea  Tums (Limit 4/day)     Unisom         Tylenol PM         Warm milk-add vanilla or  Hemorrhoids:       Sugar for taste  Anusol/Anusol H.C.  (RX: Analapram 2.5%)  Sugar Substitutes:  Hydrocortisone OTC     Ok in moderation  Preparation H      Tucks        Vaseline lotion applied to tissue with wiping    Herpes:     Throat:  Acyclovir      Oragel  Famvir  Valtrex     Vaccines:         Flu Shot Leg Cramps:       *Gardasil  Benadryl      Hepatitis A         Hepatitis B Nasal Spray:       Pneumovax  Saline Nasal Spray     Polio Booster         Tetanus Nausea:       Tuberculosis test or PPD  Vitamin B6 25 mg TID   AVOID:    Dramamine      *Gardasil  Emetrol       Live Poliovirus  Ginger Root 250 mg QID    MMR (measles, mumps &  High Complex Carbs @ Bedtime    rebella)  Sea Bands-Accupressure    Varicella (Chickenpox)  Unisom 1/2 tab TID     *No known complications           If received before Pain:         Known pregnancy;   Darvocet       Resume series after  Lortab        Delivery  Percocet  Yeast:   Tramadol      Femstat  Tylenol 3      Gyne-lotrimin  Ultram       Monistat  Vicodin           MISC:         All Sunscreens           Hair Coloring/highlights          Insect Repellant's          (Including DEET)         Mystic Tans

## 2020-07-24 LAB — TOXOPLASMA ANTIBODIES- IGG AND  IGM
Toxoplasma Antibody- IgM: <3 AU/mL (ref 0.0–7.9)
Toxoplasma IgG Ratio: <3 IU/mL (ref 0.0–7.1)

## 2020-07-24 LAB — ABO AND RH: Rh Factor: POSITIVE

## 2020-07-24 LAB — URINALYSIS, ROUTINE W REFLEX MICROSCOPIC
Bilirubin, UA: NEGATIVE
Glucose, UA: NEGATIVE
Ketones, UA: NEGATIVE
Nitrite, UA: NEGATIVE
Protein,UA: NEGATIVE
RBC, UA: NEGATIVE
Specific Gravity, UA: 1.011 (ref 1.005–1.030)
Urobilinogen, Ur: 0.2 mg/dL (ref 0.2–1.0)
pH, UA: 7 (ref 5.0–7.5)

## 2020-07-24 LAB — RUBELLA SCREEN: Rubella Antibodies, IGG: 4.68 index (ref >0.99)

## 2020-07-24 LAB — MICROSCOPIC EXAMINATION: Casts: NONE SEEN /lpf

## 2020-07-24 LAB — ANTIBODY SCREEN: Antibody Screen: NEGATIVE

## 2020-07-24 LAB — HEPATITIS B SURFACE ANTIGEN: Hepatitis B Surface Ag: NEGATIVE

## 2020-07-24 LAB — HIV ANTIBODY (ROUTINE TESTING W REFLEX): HIV Screen 4th Generation wRfx: NONREACTIVE

## 2020-07-24 LAB — VARICELLA ZOSTER ANTIBODY, IGG: Varicella zoster IgG: 480 index (ref >165)

## 2020-07-24 LAB — RPR: RPR Ser Ql: NONREACTIVE

## 2020-07-25 LAB — DRUG PROFILE, UR, 9 DRUGS (LABCORP)
Amphetamines, Urine: NEGATIVE ng/mL
Barbiturate Quant, Ur: NEGATIVE ng/mL
Benzodiazepine Quant, Ur: NEGATIVE ng/mL
Cannabinoid Quant, Ur: NEGATIVE ng/mL
Cocaine (Metab.): NEGATIVE ng/mL
Methadone Screen, Urine: NEGATIVE ng/mL
Opiate Quant, Ur: NEGATIVE ng/mL
PCP Quant, Ur: NEGATIVE ng/mL
Propoxyphene: NEGATIVE ng/mL

## 2020-07-25 LAB — URINE CULTURE, OB REFLEX

## 2020-07-25 LAB — NICOTINE SCREEN, URINE: Cotinine Ql Scrn, Ur: NEGATIVE ng/mL

## 2020-07-25 LAB — GC/CHLAMYDIA PROBE AMP
Chlamydia trachomatis, NAA: NEGATIVE
Neisseria Gonorrhoeae by PCR: NEGATIVE

## 2020-07-25 LAB — CULTURE, OB URINE

## 2020-08-05 ENCOUNTER — Encounter: Payer: Self-pay | Admitting: Certified Nurse Midwife

## 2020-08-05 ENCOUNTER — Other Ambulatory Visit: Payer: Self-pay

## 2020-08-05 ENCOUNTER — Ambulatory Visit (INDEPENDENT_AMBULATORY_CARE_PROVIDER_SITE_OTHER): Payer: 59 | Admitting: Certified Nurse Midwife

## 2020-08-05 VITALS — BP 101/65 | HR 83 | Wt 140.1 lb

## 2020-08-05 DIAGNOSIS — Z3A12 12 weeks gestation of pregnancy: Secondary | ICD-10-CM

## 2020-08-05 LAB — POCT URINALYSIS DIPSTICK OB
Bilirubin, UA: NEGATIVE
Blood, UA: NEGATIVE
Glucose, UA: NEGATIVE
Ketones, UA: NEGATIVE
Leukocytes, UA: NEGATIVE
Nitrite, UA: NEGATIVE
POC,PROTEIN,UA: NEGATIVE
Spec Grav, UA: 1.025 (ref 1.010–1.025)
Urobilinogen, UA: 0.2 E.U./dL
pH, UA: 5 (ref 5.0–8.0)

## 2020-08-05 NOTE — Patient Instructions (Signed)

## 2020-08-05 NOTE — Progress Notes (Signed)
NEW OB HISTORY AND PHYSICAL  SUBJECTIVE:       Selena White is a 29 y.o. G73P1001 female, Patient's last menstrual period was 05/13/2020 (exact date)., Estimated Date of Delivery: 02/17/21, [redacted]w[redacted]d, presents today for establishment of Prenatal Care. Shes and complains of nausea and fatigue   Social Relationship: married Living with Son and husband Works: none Exercise 3 x wk Smoke/alcohol/drugs: deneis    Gynecologic History Patient's last menstrual period was 05/13/2020 (exact date). Normal Contraception: none Last Pap: 04/02/20. Results were: normal  Obstetric History OB History  Gravida Para Term Preterm AB Living  2 1 1  0 0 1  SAB TAB Ectopic Multiple Live Births  0 0 0 0 1    # Outcome Date GA Lbr Len/2nd Weight Sex Delivery Anes PTL Lv  2 Current           1 Term             Past Medical History:  Diagnosis Date  . Arrhythmia   . Asthma   . Hand tendonitis   . Heavy periods   . Mixed hypoglycemia   . Overweight   . Painful menstrual periods   . UTI (lower urinary tract infection)     No past surgical history on file.  Current Outpatient Medications on File Prior to Visit  Medication Sig Dispense Refill  . Doxylamine-Pyridoxine (DICLEGIS) 10-10 MG TBEC Take 2 tablets by mouth at bedtime. If symptoms persist, add one tablet in the morning and one in the afternoon 100 tablet 5  . Prenatal Vit-Fe Fumarate-FA (PRENATAL MULTIVITAMIN) TABS tablet Take 1 tablet by mouth daily at 12 noon.     No current facility-administered medications on file prior to visit.    No Known Allergies  Social History   Socioeconomic History  . Marital status: Married    Spouse name: Not on file  . Number of children: Not on file  . Years of education: Not on file  . Highest education level: Not on file  Occupational History    Employer: [employer]  Tobacco Use  . Smoking status: Never Smoker  . Smokeless tobacco: Never Used  Vaping Use  . Vaping Use:  Never used  Substance and Sexual Activity  . Alcohol use: No    Alcohol/week: 0.0 standard drinks  . Drug use: No  . Sexual activity: Yes    Partners: Male    Birth control/protection: None  Other Topics Concern  . Not on file  Social History Narrative  . Not on file   Social Determinants of Health   Financial Resource Strain:   . Difficulty of Paying Living Expenses: Not on file  Food Insecurity:   . Worried About in the Last Year: Not on file  . Ran Out of Food in the Last Year: Not on file  Transportation Needs:   . Lack of Transportation (Medical): Not on file  . Lack of Transportation (Non-Medical): Not on file  Physical Activity:   . Days of Exercise per Week: Not on file  . Minutes of Exercise per Session: Not on file  Stress:   . Feeling of Stress : Not on file  Social Connections:   . Frequency of Communication with Friends and Family: Not on file  . Frequency of Social Gatherings with Friends and Family: Not on file  . Attends Religious Services: Not on file  . Active Member of Clubs or Organizations: Not on file  .  Attends Banker Meetings: Not on file  . Marital Status: Not on file  Intimate Partner Violence:   . Fear of Current or Ex-Partner: Not on file  . Emotionally Abused: Not on file  . Physically Abused: Not on file  . Sexually Abused: Not on file    Family History  Problem Relation Age of Onset  . Heart disease Father   . Diabetes Father   . Hypertension Father   . Cancer Maternal Grandmother        breast (mgm sister who has passed)  . Diabetes Maternal Grandmother   . Hypothyroidism Mother     The following portions of the patient's history were reviewed and updated as appropriate: allergies, current medications, past OB history, past medical history, past surgical history, past family history, past social history, and problem list.    OBJECTIVE: Initial Physical Exam (New OB)  GENERAL APPEARANCE:  alert, well appearing, in no apparent distress, oriented to person, place and time HEAD: normocephalic, atraumatic MOUTH: mucous membranes moist, pharynx normal without lesions THYROID: no thyromegaly or masses present BREASTS: no masses noted, no significant tenderness, no palpable axillary nodes, no skin changes LUNGS: clear to auscultation, no wheezes, rales or rhonchi, symmetric air entry HEART: regular rate and rhythm, no murmurs ABDOMEN: soft, nontender, nondistended, no abnormal masses, no epigastric pain and FHT present EXTREMITIES: no redness or tenderness in the calves or thighs SKIN: normal coloration and turgor, no rashes LYMPH NODES: no adenopathy palpable NEUROLOGIC: alert, oriented, normal speech, no focal findings or movement disorder noted  PELVIC EXAM EXTERNAL GENITALIA: normal appearing vulva with no masses, tenderness or lesions VAGINA: no abnormal discharge or lesions CERVIX: no lesions or cervical motion tenderness UTERUS: gravid ADNEXA: no masses palpable and nontender OB EXAM PELVIMETRY: appears adequate RECTUM: exam not indicated  ASSESSMENT: Normal pregnancy  PLAN: Prenatal care See ordersNew OB counseling: The patient has been given an overview regarding routine prenatal care. Recommendations regarding diet, weight gain, and exercise in pregnancy were given. Prenatal testing, optional genetic testing, carrier screening, and ultrasound use in pregnancy were reviewed. Panorama done . Benefits of Breast Feeding were discussed. The patient is encouraged to consider nursing her baby post partum.ROB 4 wks with Marcelino Duster.   Doreene Burke, CNM

## 2020-08-26 ENCOUNTER — Encounter: Payer: Self-pay | Admitting: Intensive Care

## 2020-08-26 ENCOUNTER — Other Ambulatory Visit: Payer: Self-pay

## 2020-08-26 ENCOUNTER — Emergency Department
Admission: EM | Admit: 2020-08-26 | Discharge: 2020-08-27 | Disposition: A | Discharge status: Home / Self Care | Payer: Managed Care, Other (non HMO) | Attending: Emergency Medicine | Admitting: Emergency Medicine

## 2020-08-26 ENCOUNTER — Emergency Department: Payer: Managed Care, Other (non HMO)

## 2020-08-26 DIAGNOSIS — J45909 Unspecified asthma, uncomplicated: Secondary | ICD-10-CM | POA: Insufficient documentation

## 2020-08-26 DIAGNOSIS — Z3A15 15 weeks gestation of pregnancy: Secondary | ICD-10-CM | POA: Insufficient documentation

## 2020-08-26 DIAGNOSIS — O26892 Other specified pregnancy related conditions, second trimester: Secondary | ICD-10-CM | POA: Diagnosis present

## 2020-08-26 DIAGNOSIS — Z79899 Other long term (current) drug therapy: Secondary | ICD-10-CM | POA: Insufficient documentation

## 2020-08-26 DIAGNOSIS — R1030 Lower abdominal pain, unspecified: Secondary | ICD-10-CM | POA: Diagnosis not present

## 2020-08-26 DIAGNOSIS — R102 Pelvic and perineal pain: Secondary | ICD-10-CM

## 2020-08-26 LAB — COMPREHENSIVE METABOLIC PANEL
ALT: 12 U/L (ref 0–44)
AST: 19 U/L (ref 15–41)
Albumin: 3.3 g/dL — ABNORMAL LOW (ref 3.5–5.0)
Alkaline Phosphatase: 46 U/L (ref 38–126)
Anion gap: 8 (ref 5–15)
BUN: 6 mg/dL (ref 6–20)
CO2: 25 mmol/L (ref 22–32)
Calcium: 8.4 mg/dL — ABNORMAL LOW (ref 8.9–10.3)
Chloride: 103 mmol/L (ref 98–111)
Creatinine, Ser: 0.46 mg/dL (ref 0.44–1.00)
GFR calc Af Amer: >60 mL/min (ref >60)
GFR calc non Af Amer: >60 mL/min (ref >60)
Glucose, Bld: 105 mg/dL — ABNORMAL HIGH (ref 70–99)
Potassium: 4 mmol/L (ref 3.5–5.1)
Sodium: 136 mmol/L (ref 135–145)
Total Bilirubin: 0.6 mg/dL (ref 0.3–1.2)
Total Protein: 6.4 g/dL — ABNORMAL LOW (ref 6.5–8.1)

## 2020-08-26 LAB — CBC
HCT: 33.4 % — ABNORMAL LOW (ref 36.0–46.0)
Hemoglobin: 11.2 g/dL — ABNORMAL LOW (ref 12.0–15.0)
MCH: 29.9 pg (ref 26.0–34.0)
MCHC: 33.5 g/dL (ref 30.0–36.0)
MCV: 89.1 fL (ref 80.0–100.0)
Platelets: 145 10*3/uL — ABNORMAL LOW (ref 150–400)
RBC: 3.75 MIL/uL — ABNORMAL LOW (ref 3.87–5.11)
RDW: 13.9 % (ref 11.5–15.5)
WBC: 8.4 10*3/uL (ref 4.0–10.5)
nRBC: 0 % (ref 0.0–0.2)

## 2020-08-26 LAB — LIPASE, BLOOD: Lipase: 24 U/L (ref 11–51)

## 2020-08-26 LAB — URINALYSIS, COMPLETE (UACMP) WITH MICROSCOPIC
Bilirubin Urine: NEGATIVE
Glucose, UA: NEGATIVE mg/dL
Hgb urine dipstick: NEGATIVE
Ketones, ur: 5 mg/dL — AB
Leukocytes,Ua: NEGATIVE
Nitrite: NEGATIVE
Protein, ur: NEGATIVE mg/dL
Specific Gravity, Urine: 1.009 (ref 1.005–1.030)
pH: 8 (ref 5.0–8.0)

## 2020-08-26 LAB — HCG, QUANTITATIVE, PREGNANCY: hCG, Beta Chain, Quant, S: 64136 m[IU]/mL — ABNORMAL HIGH (ref <5)

## 2020-08-26 NOTE — ED Triage Notes (Signed)
Patient is [redacted] weeks pregnant. Patient c/o right lower abdominal pain that started through the night 08/26/20 and has been consistent through today. Also one episode of emesis when pain first started. Denies vaginal bleeding or discharge.

## 2020-08-27 ENCOUNTER — Telehealth: Payer: Self-pay

## 2020-08-27 NOTE — Telephone Encounter (Signed)
mychart message sent to patient

## 2020-08-27 NOTE — ED Provider Notes (Signed)
[facility] Emergency Department Provider Note  Time seen: 8:16 AM  I have reviewed the triage vital signs and the nursing notes.   HISTORY  Chief Complaint Abdominal Pain   HPI Selena White is a 29 y.o. female with a past medical history of asthma, G2, P1 at approximately [redacted] weeks pregnant presents to the emergency department for lower abdominal pain.  According to the patient 2 days ago she was awoken from her sleep with a sharp pain to her lower abdomen somewhat more towards the right side per patient.  States the pain went away however yesterday began to gradually come back.  Patient denies any vaginal bleeding denies fluid leakage or dysuria.  Patient did have 1 episode of vomiting.  Denies any association with food.   Patient states mild discomfort currently mostly with movement per patient.  Denies any significant pain.  No fever.  Past Medical History:  Diagnosis Date  . Arrhythmia   . Asthma   . Hand tendonitis   . Heavy periods   . Mixed hypoglycemia   . Overweight   . Painful menstrual periods   . UTI (lower urinary tract infection)     There are no problems to display for this patient.   History reviewed. No pertinent surgical history.  Prior to Admission medications   Medication Sig Start Date End Date Taking? Authorizing Provider  Doxylamine-Pyridoxine (DICLEGIS) 10-10 MG TBEC Take 2 tablets by mouth at bedtime. If symptoms persist, add one tablet in the morning and one in the afternoon 06/29/20   Doreene Burke, CNM  Prenatal Vit-Fe Fumarate-FA (PRENATAL MULTIVITAMIN) TABS tablet Take 1 tablet by mouth daily at 12 noon.    [provider]    No Known Allergies  Family History  Problem Relation Age of Onset  . Heart disease Father   . Diabetes Father   . Hypertension Father   . Cancer Maternal Grandmother        breast (mgm sister who has passed)  . Diabetes Maternal Grandmother   . Hypothyroidism Mother     Social  History Social History   Tobacco Use  . Smoking status: Never Smoker  . Smokeless tobacco: Never Used  Vaping Use  . Vaping Use: Never used  Substance Use Topics  . Alcohol use: No    Alcohol/week: 0.0 standard drinks  . Drug use: No    Review of Systems Constitutional: Negative for fever. Cardiovascular: Negative for chest pain. Respiratory: Negative for shortness of breath. Gastrointestinal: Lower abdominal pain, mild.  Vomit x1. Genitourinary: Negative for urinary compaints.  Negative for vaginal bleeding or discharge. Musculoskeletal: Negative for musculoskeletal complaints Neurological: Negative for headache All other ROS negative  ____________________________________________   PHYSICAL EXAM:  VITAL SIGNS: ED Triage Vitals  Enc Vitals Group     BP 08/26/20 1831 107/67     Pulse Rate 08/26/20 1831 81     Resp 08/26/20 1831 18     Temp 08/26/20 1829 99.1 F (37.3 C)     Temp Source 08/26/20 1829 Oral     SpO2 08/26/20 1831 100 %     Weight 08/26/20 1830 140 lb (63.5 kg)     Height 08/26/20 1830 5\' 1"  (1.549 m)     Head Circumference --      Peak Flow --      Pain Score 08/26/20 1830 6     Pain Loc --      Pain Edu? --      Excl.  in GC? --    Constitutional: Alert and oriented. Well appearing and in no distress. Eyes: Normal exam ENT      Head: Normocephalic and atraumatic.      Mouth/Throat: Mucous membranes are moist. Cardiovascular: Normal rate, regular rhythm. Respiratory: Normal respiratory effort without tachypnea nor retractions. Breath sounds are clear  Gastrointestinal: Abdomen is soft, slight suprapubic tenderness otherwise benign abdomen, no rebound guarding or distention.  Specifically no significant tenderness over McBurney's point or in the right upper quadrant. Musculoskeletal: Nontender with normal range of motion in all extremities.  Neurologic:  Normal speech and language. No gross focal neurologic deficits  Skin:  Skin is warm, dry and  intact.  Psychiatric: Mood and affect are normal.    RADIOLOGY  Ultrasound shows single IUP 15 weeks and 5 days with a normal heart rate of 155.  ____________________________________________   INITIAL IMPRESSION / ASSESSMENT AND PLAN / ED COURSE  Pertinent labs & imaging results that were available during my care of the patient were reviewed by me and considered in my medical decision making (see chart for details).   Patient presents emergency department for sharp lower abdominal pain that occurred 2 days ago somewhat recurred yesterday.  Patient states very mild discomfort currently mostly with movements.  Reassuring exam, slight suprapubic tenderness.  Ultrasound however shows single live IUP at 15 weeks 5 days which appears normal and reassuring.  Lab work has resulted normal and reassuring including a normal white blood cell count.  At this time given her reassuring work-up reassuring physical exam and reassuring ultrasound I believe the patient is safe for discharge home.  I did discuss strict return precautions for worsening abdominal pain or development of fever.  Patient agreeable to plan of care.  Selena White was evaluated in Emergency Department on 08/27/2020 for the symptoms described in the history of present illness. She was evaluated in the context of the global COVID-19 pandemic, which necessitated consideration that the patient might be at risk for infection with the SARS-CoV-2 virus that causes COVID-19. Institutional protocols and algorithms that pertain to the evaluation of patients at risk for COVID-19 are in a state of rapid change based on information released by regulatory bodies including the CDC and federal and state organizations. These policies and algorithms were followed during the patient's care in the ED.  ____________________________________________   FINAL CLINICAL IMPRESSION(S) / ED DIAGNOSES  Lower abdominal pain   Minna Antis, MD 08/27/20  [phone]

## 2020-08-27 NOTE — ED Notes (Signed)
Pt verbalizes understanding of d/c instructions, medications and follow up 

## 2020-08-31 ENCOUNTER — Other Ambulatory Visit: Payer: Self-pay

## 2020-08-31 ENCOUNTER — Ambulatory Visit (INDEPENDENT_AMBULATORY_CARE_PROVIDER_SITE_OTHER): Payer: 59 | Admitting: Certified Nurse Midwife

## 2020-08-31 ENCOUNTER — Encounter: Payer: Self-pay | Admitting: Certified Nurse Midwife

## 2020-08-31 VITALS — BP 96/62 | Wt 141.2 lb

## 2020-08-31 DIAGNOSIS — Z3482 Encounter for supervision of other normal pregnancy, second trimester: Secondary | ICD-10-CM | POA: Diagnosis not present

## 2020-08-31 DIAGNOSIS — Z3689 Encounter for other specified antenatal screening: Secondary | ICD-10-CM

## 2020-08-31 DIAGNOSIS — Z3A15 15 weeks gestation of pregnancy: Secondary | ICD-10-CM

## 2020-08-31 DIAGNOSIS — Z23 Encounter for immunization: Secondary | ICD-10-CM | POA: Diagnosis not present

## 2020-08-31 LAB — POCT URINALYSIS DIPSTICK OB
Bilirubin, UA: NEGATIVE
Blood, UA: NEGATIVE
Glucose, UA: NEGATIVE
Ketones, UA: NEGATIVE
Leukocytes, UA: NEGATIVE
Nitrite, UA: NEGATIVE
POC,PROTEIN,UA: NEGATIVE
Spec Grav, UA: 1.015 (ref 1.010–1.025)
Urobilinogen, UA: 0.2 E.U./dL
pH, UA: 7 (ref 5.0–8.0)

## 2020-08-31 NOTE — Patient Instructions (Signed)

## 2020-09-01 NOTE — Progress Notes (Signed)
ROB-Doing well. Seen in ER for abdominal pain after [employer]. Discussed home treatment measures. Flu vaccine given, see chart. Anticipatory guidance regarding course of prenatal care. Reviewed red flag symptoms and when to call. RTC x 4 weeks for ANATOMY SCAN and ROB or sooner if needed.

## 2020-10-07 ENCOUNTER — Other Ambulatory Visit: Payer: 59

## 2020-10-07 ENCOUNTER — Encounter: Payer: 59 | Admitting: Certified Nurse Midwife

## 2020-10-14 ENCOUNTER — Ambulatory Visit (INDEPENDENT_AMBULATORY_CARE_PROVIDER_SITE_OTHER): Payer: 59

## 2020-10-14 ENCOUNTER — Other Ambulatory Visit: Payer: Self-pay

## 2020-10-14 ENCOUNTER — Ambulatory Visit (INDEPENDENT_AMBULATORY_CARE_PROVIDER_SITE_OTHER): Payer: 59 | Admitting: Certified Nurse Midwife

## 2020-10-14 ENCOUNTER — Encounter: Payer: Self-pay | Admitting: Certified Nurse Midwife

## 2020-10-14 VITALS — BP 111/58 | HR 80 | Wt 151.1 lb

## 2020-10-14 DIAGNOSIS — Z3482 Encounter for supervision of other normal pregnancy, second trimester: Secondary | ICD-10-CM

## 2020-10-14 DIAGNOSIS — Z3A15 15 weeks gestation of pregnancy: Secondary | ICD-10-CM | POA: Diagnosis not present

## 2020-10-14 DIAGNOSIS — Z3A21 21 weeks gestation of pregnancy: Secondary | ICD-10-CM

## 2020-10-14 DIAGNOSIS — Z3689 Encounter for other specified antenatal screening: Secondary | ICD-10-CM | POA: Diagnosis not present

## 2020-10-14 NOTE — Progress Notes (Signed)
ROB doing well. U/s today for anatomy, results reviewd. U/s incomplete for spine, She will return in 2 wks for completion. Discussed round ligament pain  And self help measures. Follow up 2 wks for u/s and 4 wks for ROB with Marcelino Duster.   Doreene Burke, CNM   Patient Name: Selena White DOB: [birth year] MRN: [id number] ULTRASOUND REPORT  Location: [facility] Date of Service: 10/14/2020   Indications:Anatomy Ultrasound Findings:  Mason Jim intrauterine pregnancy is visualized with FHR at 152 BPM. Biometrics give an (U/S) Gestational age of [redacted]w[redacted]d and an (U/S) EDD of 02/17/2021; this correlates with the clinically established Estimated Date of Delivery: 02/17/21  Fetal presentation is transverse.  EFW: 50 g ( 1 lb 2 oz). Fetal Percentile  Placenta: posterior. Grade: 1 AFI: subjectively normal.  Anatomic survey is incomplete for Spine and normal; Gender - female.    Right Ovary is normal in appearance. Left Ovary is normal appearance. Survey of the adnexa demonstrates no adnexal masses. There is no free peritoneal fluid in the cul de sac.  Impression: 1. [redacted]w[redacted]d Viable Singleton Intrauterine pregnancy by U/S. 2. (U/S) EDD is consistent with Clinically established Estimated Date of Delivery: 02/17/21 . 3. Limited spine.  Recommendations: 1.Clinical correlation with the patient's History and Physical Exam.   Jenine M. Marciano Sequin    RDMS

## 2020-10-14 NOTE — Patient Instructions (Signed)

## 2020-10-20 ENCOUNTER — Telehealth: Payer: Self-pay

## 2020-10-20 NOTE — Telephone Encounter (Signed)
Pt called back and stated that she got a call from [place]. The pt was calling back to go over her ob payment plan. I went over it with her and she verbally understood. She will make her 1st payment on 11/12/2020

## 2020-10-28 ENCOUNTER — Ambulatory Visit (INDEPENDENT_AMBULATORY_CARE_PROVIDER_SITE_OTHER): Payer: 59

## 2020-10-28 ENCOUNTER — Other Ambulatory Visit: Payer: Self-pay

## 2020-10-28 DIAGNOSIS — Z3A21 21 weeks gestation of pregnancy: Secondary | ICD-10-CM

## 2020-11-12 ENCOUNTER — Ambulatory Visit (INDEPENDENT_AMBULATORY_CARE_PROVIDER_SITE_OTHER): Payer: 59 | Admitting: Certified Nurse Midwife

## 2020-11-12 ENCOUNTER — Encounter: Payer: Self-pay | Admitting: Certified Nurse Midwife

## 2020-11-12 ENCOUNTER — Other Ambulatory Visit: Payer: Self-pay

## 2020-11-12 VITALS — BP 108/68 | Wt 155.3 lb

## 2020-11-12 DIAGNOSIS — Z3482 Encounter for supervision of other normal pregnancy, second trimester: Secondary | ICD-10-CM

## 2020-11-12 DIAGNOSIS — R319 Hematuria, unspecified: Secondary | ICD-10-CM

## 2020-11-12 DIAGNOSIS — Z3A26 26 weeks gestation of pregnancy: Secondary | ICD-10-CM

## 2020-11-12 LAB — POCT URINALYSIS DIPSTICK OB
Bilirubin, UA: NEGATIVE
Glucose, UA: NEGATIVE
Ketones, UA: NEGATIVE
Nitrite, UA: NEGATIVE
POC,PROTEIN,UA: NEGATIVE
Spec Grav, UA: 1.015 (ref 1.010–1.025)
Urobilinogen, UA: 0.2 E.U./dL
pH, UA: 7 (ref 5.0–8.0)

## 2020-11-12 NOTE — Patient Instructions (Addendum)
WHAT OB PATIENTS CAN EXPECT   Confirmation of pregnancy and ultrasound ordered if medically indicated-[redacted] weeks gestation  New OB (NOB) intake with nurse and New OB (NOB) labs- [redacted] weeks gestation  New OB (NOB) physical examination with provider- 11/[redacted] weeks gestation  Flu vaccine-[redacted] weeks gestation  Anatomy scan-[redacted] weeks gestation  Glucose tolerance test, blood work to test for anemia, T-dap vaccine-[redacted] weeks gestation  Vaginal swabs/cultures-STD/Group B strep-[redacted] weeks gestation  Appointments every 4 weeks until 28 weeks  Every 2 weeks from 28 weeks until 36 weeks  Weekly visits from 36 weeks until delivery    Round Ligament Pain  The round ligament is a cord of muscle and tissue that helps support the uterus. It can become a source of pain during pregnancy if it becomes stretched or twisted as the baby grows. The pain usually begins in the second trimester (13-28 weeks) of pregnancy, and it can come and go until the baby is delivered. It is not a serious problem, and it does not cause harm to the baby. Round ligament pain is usually a short, sharp, and pinching pain, but it can also be a dull, lingering, and aching pain. The pain is felt in the lower side of the abdomen or in the groin. It usually starts deep in the groin and moves up to the outside of the hip area. The pain may occur when you:  Suddenly change position, such as quickly going from a sitting to standing position.  Roll over in bed.  Cough or sneeze.  Do physical activity. Follow these instructions at home:   Watch your condition for any changes.  When the pain starts, relax. Then try any of these methods to help with the pain: ? Sitting down. ? Flexing your knees up to your abdomen. ? Lying on your side with one pillow under your abdomen and another pillow between your legs. ? Sitting in a warm bath for 15-20 minutes or until the pain goes away.  Take over-the-counter and prescription medicines only as  told by your health care provider.  Move slowly when you sit down or stand up.  Avoid long walks if they cause pain.  Stop or reduce your physical activities if they cause pain.  Keep all follow-up visits as told by your health care provider. This is important. Contact a health care provider if:  Your pain does not go away with treatment.  You feel pain in your back that you did not have before.  Your medicine is not helping. Get help right away if:  You have a fever or chills.  You develop uterine contractions.  You have vaginal bleeding.  You have nausea or vomiting.  You have diarrhea.  You have pain when you urinate. Summary  Round ligament pain is felt in the lower abdomen or groin. It is usually a short, sharp, and pinching pain. It can also be a dull, lingering, and aching pain.  This pain usually begins in the second trimester (13-28 weeks). It occurs because the uterus is stretching with the growing baby, and it is not harmful to the baby.  You may notice the pain when you suddenly change position, when you cough or sneeze, or during physical activity.  Relaxing, flexing your knees to your abdomen, lying on one side, or taking a warm bath may help to get rid of the pain.  Get help from your health care provider if the pain does not go away or if you have vaginal bleeding,  nausea, vomiting, diarrhea, or painful urination. This information is not intended to replace advice given to you by your health care provider. Make sure you discuss any questions you have with your health care provider. Document Revised: 05/16/2018 Document Reviewed: 05/16/2018 Elsevier Patient Education  2020 [employer].

## 2020-11-12 NOTE — Progress Notes (Signed)
ROB-Reports lower abdominal cramping. Chiropractor appointment was yesterday. Discussed home treatment measures, copy of three sisters of balance provided. Anticipatory guidance regarding course of prenatal care. Reviewed red flag symptoms and when to call. RTC x 3-4 weeks for 28 week labs, TDaP, and ROB or sooner if needed

## 2020-11-12 NOTE — Progress Notes (Signed)
Pt c/o "period like" cramping in her pubic area. No complaints. Hematuria on dipstick

## 2020-11-14 LAB — URINE CULTURE: Organism ID, Bacteria: NO GROWTH

## 2020-11-30 ENCOUNTER — Ambulatory Visit (INDEPENDENT_AMBULATORY_CARE_PROVIDER_SITE_OTHER): Payer: Managed Care, Other (non HMO)

## 2020-11-30 ENCOUNTER — Encounter: Payer: Self-pay | Admitting: Cardiology

## 2020-11-30 ENCOUNTER — Other Ambulatory Visit: Payer: Self-pay | Admitting: Certified Nurse Midwife

## 2020-11-30 ENCOUNTER — Ambulatory Visit (INDEPENDENT_AMBULATORY_CARE_PROVIDER_SITE_OTHER): Payer: Managed Care, Other (non HMO) | Admitting: Cardiology

## 2020-11-30 ENCOUNTER — Other Ambulatory Visit: Payer: Self-pay

## 2020-11-30 VITALS — BP 104/62 | HR 91 | Ht 61.0 in | Wt 157.5 lb

## 2020-11-30 DIAGNOSIS — R002 Palpitations: Secondary | ICD-10-CM | POA: Diagnosis not present

## 2020-11-30 DIAGNOSIS — Z3A29 29 weeks gestation of pregnancy: Secondary | ICD-10-CM | POA: Diagnosis not present

## 2020-11-30 DIAGNOSIS — Z8679 Personal history of other diseases of the circulatory system: Secondary | ICD-10-CM

## 2020-11-30 DIAGNOSIS — R0602 Shortness of breath: Secondary | ICD-10-CM

## 2020-11-30 DIAGNOSIS — Z3483 Encounter for supervision of other normal pregnancy, third trimester: Secondary | ICD-10-CM

## 2020-11-30 NOTE — Patient Instructions (Addendum)
Medication Instructions:   Your physician recommends that you continue on your current medications as directed. Please refer to the Current Medication list given to you today.  *If you need a refill on your cardiac medications before your next appointment, please call your pharmacy*   Lab Work: None Ordered If you have labs (blood work) drawn today and your tests are completely normal, you will receive your results only by: Marland Kitchen MyChart Message (if you have MyChart) OR . A paper copy in the mail If you have any lab test that is abnormal or we need to change your treatment, we will call you to review the results.   Testing/Procedures:  1.  Your physician has requested that you have an echocardiogram. Echocardiography is a painless test that uses sound waves to create images of your heart. It provides your doctor with information about the size and shape of your heart and how well your heart's chambers and valves are working. This procedure takes approximately one hour. There are no restrictions for this procedure.    2.  Your physician has recommended that you wear a Zio monitor 2 weeks. This monitor is a medical device that records the heart's electrical activity. Doctors most often use these monitors to diagnose arrhythmias. Arrhythmias are problems with the speed or rhythm of the heartbeat. The monitor is a small device applied to your chest. You can wear one while you do your normal daily activities. While wearing this monitor if you have any symptoms to push the button and record what you felt. Once you have worn this monitor for the period of time provider prescribed (Usually 14 days), you will return the monitor device in the postage paid box. Once it is returned they will download the data collected and provide [place] with a report which the provider will then review and we will call you with those results. Important tips:  1. Avoid showering during the first 24 hours of wearing the  monitor. 2. Avoid excessive sweating to help maximize wear time. 3. Do not submerge the device, no hot tubs, and no swimming pools. 4. Keep any lotions or oils away from the patch. 5. After 24 hours you may shower with the patch on. Take brief showers with your back facing the shower head.  6. Do not remove patch once it has been placed because that will interrupt data and decrease adhesive wear time. 7. Push the button when you have any symptoms and write down what you were feeling. 8. Once you have completed wearing your monitor, remove and place into box which has postage paid and place in your outgoing mailbox.  9. If for some reason you have misplaced your box then call our office and we can provide another box and/or mail it off for you.          Follow-Up: At [facility], you and your health needs are our priority.  As part of our continuing mission to provide you with exceptional heart care, we have created designated Provider Care Teams.  These Care Teams include your primary Cardiologist (physician) and Advanced Practice Providers (APPs -  Physician Assistants and Nurse Practitioners) who all work together to provide you with the care you need, when you need it.  We recommend signing up for the patient portal called "MyChart".  Sign up information is provided on this After Visit Summary.  MyChart is used to connect with patients for Virtual Visits (Telemedicine).  Patients are able to view lab/test results,  encounter notes, upcoming appointments, etc.  Non-urgent messages can be sent to your provider as well.   To learn more about what you can do with MyChart, go to [email or web address].    Your next appointment:   6 week(s)  The format for your next appointment:   In Person  Provider:   Debbe Odea, MD

## 2020-11-30 NOTE — Progress Notes (Signed)
Cardiology Office Note:    Date:  11/30/2020   ID:  Selena White, DOB 29-Nov-1991, MRN [id number]  PCP:  Patient, No Pcp Per  [facility] Cardiologist:  Debbe Odea, MD  [facility] Electrophysiologist:  None   Referring MD: Doreene Burke, CNM   Chief Complaint  Patient presents with  . Other    Arrhythmia. Meds reviewed verbally with pt.   Selena White is a 29 y.o. female who is being seen today for the evaluation of arrhythmia at the request of Doreene Burke, [place].   History of Present Illness:    Selena White is a 29 y.o. female with a hx of irregular heartbeats, currently [redacted] weeks pregnant who presents due to palpitations.  Patient states having symptoms of palpitations on and off for the past 4 days.  Symptoms are sometimes associated with dizziness.  She also has some shortness of breath.  She has a history of irregular heartbeat, saw [facility] cardiology about 6 years ago, was placed on a Holter monitor which did not show any abnormalities.  Previously was placed on beta-blocker, but stopped.  She denies any history of heart disease.  Denies smoking.  Family history of congestive heart failure in her father.  Father was also a smoker.  Past Medical History:  Diagnosis Date  . Arrhythmia   . Asthma   . Hand tendonitis   . Heavy periods   . Mixed hypoglycemia   . Overweight   . Painful menstrual periods   . UTI (lower urinary tract infection)     History reviewed. No pertinent surgical history.  Current Medications: Current Meds  Medication Sig  . Doxylamine-Pyridoxine (DICLEGIS) 10-10 MG TBEC Take 2 tablets by mouth at bedtime. If symptoms persist, add one tablet in the morning and one in the afternoon  . Prenatal Vit-Fe Fumarate-FA (PRENATAL MULTIVITAMIN) TABS tablet Take 1 tablet by mouth daily at 12 noon.     Allergies:   Patient has no known allergies.   Social History   Socioeconomic History  . Marital status: Married    Spouse name: Not on file   . Number of children: Not on file  . Years of education: Not on file  . Highest education level: Not on file  Occupational History    Employer: [employer]  Tobacco Use  . Smoking status: Never Smoker  . Smokeless tobacco: Never Used  Vaping Use  . Vaping Use: Never used  Substance and Sexual Activity  . Alcohol use: No    Alcohol/week: 0.0 standard drinks  . Drug use: No  . Sexual activity: Yes    Partners: Male    Birth control/protection: None  Other Topics Concern  . Not on file  Social History Narrative  . Not on file   Social Determinants of Health   Financial Resource Strain: Not on file  Food Insecurity: Not on file  Transportation Needs: Not on file  Physical Activity: Not on file  Stress: Not on file  Social Connections: Not on file     Family History: The patient's family history includes Cancer in her maternal grandmother; Diabetes in her father and maternal grandmother; Heart disease in her father; Heart failure in her father; Hypertension in her father; Hypothyroidism in her mother.  ROS:   Please see the history of present illness.     All other systems reviewed and are negative.  EKGs/Labs/Other Studies Reviewed:    The following studies were reviewed today:   EKG:  EKG is  ordered today.  The ekg ordered today demonstrates normal sinus rhythm, normal ECG.  Recent Labs: 04/02/2020: TSH 2.090 08/26/2020: ALT 12; BUN 6; Creatinine, Ser 0.46; Hemoglobin 11.2; Platelets 145; Potassium 4.0; Sodium 136  Recent Lipid Panel No results found for: CHOL, TRIG, HDL, CHOLHDL, VLDL, LDLCALC, LDLDIRECT   Risk Assessment/Calculations:      Physical Exam:    VS:  BP 104/62 (BP Location: Right Arm, Patient Position: Sitting, Cuff Size: Normal)   Pulse 91   Ht 5\' 1"  (1.549 m)   Wt 157 lb 8 oz (71.4 kg)   LMP 05/13/2020 (Exact Date)   SpO2 98%   BMI 29.76 kg/m     Wt Readings from Last 3 Encounters:  11/30/20 157 lb 8 oz (71.4 kg)   11/12/20 155 lb 4.8 oz (70.4 kg)  10/14/20 151 lb 2 oz (68.5 kg)     GEN:  Well nourished, well developed in no acute distress HEENT: Normal NECK: No JVD; No carotid bruits LYMPHATICS: No lymphadenopathy CARDIAC: RRR, no murmurs, rubs, gallops RESPIRATORY:  Clear to auscultation without rales, wheezing or rhonchi  ABDOMEN: Soft, non-tender, distended MUSCULOSKELETAL:  No edema; No deformity  SKIN: Warm and dry NEUROLOGIC:  Alert and oriented x 3 PSYCHIATRIC:  Normal affect   ASSESSMENT:    1. Palpitations   2. [redacted] weeks gestation of pregnancy   3. SOB (shortness of breath)    PLAN:    In order of problems listed above:  1. Palpitations, currently pregnant, place a cardiac monitor x2 weeks to evaluate any significant arrhythmias. 2. History of irregular heartbeats, [redacted] weeks pregnant also with some shortness of breath.  Get echocardiogram to evaluate any systolic or diastolic function.  Follow-up after cardiac monitor and echocardiogram.   Medication Adjustments/Labs and Tests Ordered: Current medicines are reviewed at length with the patient today.  Concerns regarding medicines are outlined above.  Orders Placed This Encounter  Procedures  . LONG TERM MONITOR (3-14 DAYS)  . EKG 12-Lead  . ECHOCARDIOGRAM COMPLETE   No orders of the defined types were placed in this encounter.   Patient Instructions  Medication Instructions:   Your physician recommends that you continue on your current medications as directed. Please refer to the Current Medication list given to you today.  *If you need a refill on your cardiac medications before your next appointment, please call your pharmacy*   Lab Work: None Ordered If you have labs (blood work) drawn today and your tests are completely normal, you will receive your results only by: 13/03/21 MyChart Message (if you have MyChart) OR . A paper copy in the mail If you have any lab test that is abnormal or we need to change your  treatment, we will call you to review the results.   Testing/Procedures:  1.  Your physician has requested that you have an echocardiogram. Echocardiography is a painless test that uses sound waves to create images of your heart. It provides your doctor with information about the size and shape of your heart and how well your heart's chambers and valves are working. This procedure takes approximately one hour. There are no restrictions for this procedure.    2.  Your physician has recommended that you wear a Zio monitor 2 weeks. This monitor is a medical device that records the heart's electrical activity. Doctors most often use these monitors to diagnose arrhythmias. Arrhythmias are problems with the speed or rhythm of the heartbeat. The monitor is a small device  applied to your chest. You can wear one while you do your normal daily activities. While wearing this monitor if you have any symptoms to push the button and record what you felt. Once you have worn this monitor for the period of time provider prescribed (Usually 14 days), you will return the monitor device in the postage paid box. Once it is returned they will download the data collected and provide [place] with a report which the provider will then review and we will call you with those results. Important tips:  1. Avoid showering during the first 24 hours of wearing the monitor. 2. Avoid excessive sweating to help maximize wear time. 3. Do not submerge the device, no hot tubs, and no swimming pools. 4. Keep any lotions or oils away from the patch. 5. After 24 hours you may shower with the patch on. Take brief showers with your back facing the shower head.  6. Do not remove patch once it has been placed because that will interrupt data and decrease adhesive wear time. 7. Push the button when you have any symptoms and write down what you were feeling. 8. Once you have completed wearing your monitor, remove and place into box which has postage  paid and place in your outgoing mailbox.  9. If for some reason you have misplaced your box then call our office and we can provide another box and/or mail it off for you.          Follow-Up: At [facility], you and your health needs are our priority.  As part of our continuing mission to provide you with exceptional heart care, we have created designated Provider Care Teams.  These Care Teams include your primary Cardiologist (physician) and Advanced Practice Providers (APPs -  Physician Assistants and Nurse Practitioners) who all work together to provide you with the care you need, when you need it.  We recommend signing up for the patient portal called "MyChart".  Sign up information is provided on this After Visit Summary.  MyChart is used to connect with patients for Virtual Visits (Telemedicine).  Patients are able to view lab/test results, encounter notes, upcoming appointments, etc.  Non-urgent messages can be sent to your provider as well.   To learn more about what you can do with MyChart, go to [email or web address].    Your next appointment:   6 week(s)  The format for your next appointment:   In Person  Provider:   Debbe Odea, MD            Signed, Debbe Odea, MD  11/30/2020 4:35 PM    [facility]

## 2020-12-02 ENCOUNTER — Other Ambulatory Visit: Payer: 59

## 2020-12-02 ENCOUNTER — Encounter: Payer: 59 | Admitting: Certified Nurse Midwife

## 2020-12-07 ENCOUNTER — Encounter: Payer: Self-pay | Admitting: Certified Nurse Midwife

## 2020-12-07 ENCOUNTER — Telehealth: Payer: Self-pay

## 2020-12-07 DIAGNOSIS — Z3480 Encounter for supervision of other normal pregnancy, unspecified trimester: Secondary | ICD-10-CM | POA: Insufficient documentation

## 2020-12-07 DIAGNOSIS — U071 COVID-19: Secondary | ICD-10-CM | POA: Insufficient documentation

## 2020-12-07 DIAGNOSIS — Z3483 Encounter for supervision of other normal pregnancy, third trimester: Secondary | ICD-10-CM

## 2020-12-07 DIAGNOSIS — R059 Cough, unspecified: Secondary | ICD-10-CM

## 2020-12-07 NOTE — Telephone Encounter (Signed)
I sent a message to the [facility]Hotline after hours. Can you follow up to make sure the patient gets scheduled for infusion. Thanks, JML

## 2020-12-07 NOTE — Telephone Encounter (Signed)
Patient called in stating that she tested positive for COVID and that she was wanting to get the antibody infusion however there needs to be an order placed in order for her to get that, could you please advise?

## 2020-12-07 NOTE — Telephone Encounter (Signed)
Telephone call to patient, verified full name and date of birth.   Patient reports positive COVID testing today (12/07/2020) with symptoms (cough) since last Tuesday (12/01/2020) and not feeling well since last Thursday.   Endorses positive fetal movement. No other concerns. Requests Monoclonal Antibody infusion.   Advised patient that email was sent to [email or web address] and someone should be contacting her to schedule an outpatient appointment during business hours.   Reviewed red flag symptoms and when to call.   Serafina Royals, CNM [facility], [facility] 12/07/20 5:24 PM

## 2020-12-07 NOTE — Telephone Encounter (Signed)
Please advise. Thanks Airrion Otting 

## 2020-12-08 ENCOUNTER — Telehealth: Payer: Self-pay

## 2020-12-08 NOTE — Telephone Encounter (Signed)
mychart message sent

## 2020-12-12 NOTE — L&D Delivery Note (Signed)
Delivery Note  1932 In room to see patient, reports pelvic pressure and the urge to push. Fetal vertex visible on perineum with maternal pushing.   Spontaneous vaginal birth of liveborn female infant in left occiput anterior position at 90. Infant immediately to maternal abdomen, delayed cord clamping, three (3) vessel cord, and skin to skin. APGARs: 7, 9. Weight pending. Receiving nurse present at bedside for birth.   Pitocin bolus infusing. Delivery of intact placenta at 1943. Uterus firm. Second degree perineal laceration repaired with 3-0 vicryl rapide after local anesthesia. Laceration well approximated and hemostatic. Vault check completed, counts correct x 2. Uterus firm. Rubra small. QBL: pending.   Initiate routine postpartum care and orders. Mom to postpartum.  Baby to Couplet care / Skin to Skin.  FOB present at bedside and overjoyed with the birth of "Walls".    Serafina Royals, CNM [facility], [facility] 01/29/2021, 8:21 PM

## 2020-12-16 ENCOUNTER — Other Ambulatory Visit: Payer: Self-pay

## 2020-12-16 ENCOUNTER — Encounter: Payer: Self-pay | Admitting: Certified Nurse Midwife

## 2020-12-16 ENCOUNTER — Other Ambulatory Visit: Payer: 59

## 2020-12-16 ENCOUNTER — Ambulatory Visit (INDEPENDENT_AMBULATORY_CARE_PROVIDER_SITE_OTHER): Payer: 59 | Admitting: Certified Nurse Midwife

## 2020-12-16 VITALS — BP 104/70 | HR 107 | Wt 158.1 lb

## 2020-12-16 DIAGNOSIS — Z3A31 31 weeks gestation of pregnancy: Secondary | ICD-10-CM

## 2020-12-16 LAB — POCT URINALYSIS DIPSTICK OB
Bilirubin, UA: NEGATIVE
Blood, UA: NEGATIVE
Glucose, UA: NEGATIVE
Ketones, UA: NEGATIVE
Leukocytes, UA: NEGATIVE
Nitrite, UA: NEGATIVE
POC,PROTEIN,UA: NEGATIVE
Spec Grav, UA: 1.02 (ref 1.010–1.025)
Urobilinogen, UA: 0.2 E.U./dL
pH, UA: 6 (ref 5.0–8.0)

## 2020-12-16 MED ORDER — TETANUS-DIPHTH-ACELL PERTUSSIS 5-2.5-18.5 LF-MCG/0.5 IM SUSY: 0.5000 mL | PREFILLED_SYRINGE | Freq: Once | INTRAMUSCULAR | Status: DC

## 2020-12-16 NOTE — Patient Instructions (Signed)

## 2020-12-16 NOTE — Progress Notes (Signed)
ROB doing well. Feels good fetal movement. Glucose/RPR/CBC/BTC/Tdap today. Discussed RSB, see check list for topics reviewed. Discussed birth plan  And birth control after delivery. Information packets given. Will follow up with lab results. ROB in 2 wks with Marcelino Duster.   Doreene Burke, CNM

## 2020-12-17 LAB — CBC
Hematocrit: 31.8 % — ABNORMAL LOW (ref 34.0–46.6)
Hemoglobin: 10.6 g/dL — ABNORMAL LOW (ref 11.1–15.9)
MCH: 28.7 pg (ref 26.6–33.0)
MCHC: 33.3 g/dL (ref 31.5–35.7)
MCV: 86 fL (ref 79–97)
Platelets: 162 10*3/uL (ref 150–450)
RBC: 3.69 x10E6/uL — ABNORMAL LOW (ref 3.77–5.28)
RDW: 12.7 % (ref 11.7–15.4)
WBC: 8.4 10*3/uL (ref 3.4–10.8)

## 2020-12-17 LAB — RPR: RPR Ser Ql: NONREACTIVE

## 2020-12-17 LAB — GLUCOSE, 1 HOUR GESTATIONAL: Gestational Diabetes Screen: 152 mg/dL — ABNORMAL HIGH (ref 65–139)

## 2020-12-18 ENCOUNTER — Other Ambulatory Visit: Payer: Self-pay | Admitting: Certified Nurse Midwife

## 2020-12-18 DIAGNOSIS — O99810 Abnormal glucose complicating pregnancy: Secondary | ICD-10-CM

## 2020-12-18 MED ORDER — FUSION PLUS PO CAPS: 1.0000 | ORAL_CAPSULE | Freq: Every day | ORAL | 6 refills | Status: DC

## 2020-12-18 NOTE — Progress Notes (Signed)
1 hr glucose abnormal , 3 hr order placed. PT notified via my chart.   Doreene Burke, CNM

## 2020-12-23 ENCOUNTER — Other Ambulatory Visit: Payer: 59

## 2020-12-25 ENCOUNTER — Other Ambulatory Visit: Payer: Self-pay

## 2020-12-25 ENCOUNTER — Other Ambulatory Visit: Payer: 59

## 2020-12-25 DIAGNOSIS — O99810 Abnormal glucose complicating pregnancy: Secondary | ICD-10-CM

## 2020-12-26 LAB — GESTATIONAL GLUCOSE TOLERANCE
Glucose, Fasting: 84 mg/dL (ref 65–94)
Glucose, GTT - 1 Hour: 111 mg/dL (ref 65–179)
Glucose, GTT - 2 Hour: 169 mg/dL — ABNORMAL HIGH (ref 65–154)
Glucose, GTT - 3 Hour: 124 mg/dL (ref 65–139)

## 2020-12-30 ENCOUNTER — Other Ambulatory Visit: Payer: Self-pay

## 2020-12-30 ENCOUNTER — Ambulatory Visit (INDEPENDENT_AMBULATORY_CARE_PROVIDER_SITE_OTHER): Payer: Managed Care, Other (non HMO)

## 2020-12-30 DIAGNOSIS — R0602 Shortness of breath: Secondary | ICD-10-CM

## 2020-12-30 LAB — ECHOCARDIOGRAM COMPLETE
AR max vel: 1.64 cm2
AV Area VTI: 1.75 cm2
AV Area mean vel: 1.65 cm2
AV Mean grad: 3 mmHg
AV Peak grad: 6.5 mmHg
Ao pk vel: 1.27 m/s
Area-P 1/2: 5.02 cm2
Calc EF: 50 %
S' Lateral: 3.2 cm
Single Plane A2C EF: 47.2 %
Single Plane A4C EF: 50.4 %

## 2020-12-31 ENCOUNTER — Ambulatory Visit (INDEPENDENT_AMBULATORY_CARE_PROVIDER_SITE_OTHER): Payer: 59 | Admitting: Certified Nurse Midwife

## 2020-12-31 VITALS — BP 109/59 | HR 98 | Wt 161.4 lb

## 2020-12-31 DIAGNOSIS — Z3483 Encounter for supervision of other normal pregnancy, third trimester: Secondary | ICD-10-CM

## 2020-12-31 DIAGNOSIS — Z3A33 33 weeks gestation of pregnancy: Secondary | ICD-10-CM

## 2020-12-31 LAB — POCT URINALYSIS DIPSTICK OB
Bilirubin, UA: NEGATIVE
Blood, UA: NEGATIVE
Glucose, UA: NEGATIVE
Ketones, UA: NEGATIVE
Leukocytes, UA: NEGATIVE
Nitrite, UA: NEGATIVE
POC,PROTEIN,UA: NEGATIVE
Spec Grav, UA: 1.025 (ref 1.010–1.025)
Urobilinogen, UA: 0.2 E.U./dL
pH, UA: 5 (ref 5.0–8.0)

## 2020-12-31 NOTE — Progress Notes (Signed)
ROB- Reports back and pubic and pelvic pain, improving with chiropractor care. Anticipatory guidance regarding course of prenatal care. Reviewed red flag symptoms and when to call. RTC x 2 weeks for ROB with ANNIE or sooner if needed.   Juliann Pares, Student-MidWife [employer] 12/31/20 11:54 AM

## 2020-12-31 NOTE — Progress Notes (Signed)
I have seen, interviewed, and examined the patient in conjunction with the [employer] and affirm the diagnosis and management plan.   Gunnar Bulla, CNM [facility], [facility] 12/31/20 12:25 PM

## 2020-12-31 NOTE — Patient Instructions (Addendum)
 Fetal Movement Counts Patient Name: ________________________________________________ Patient Due Date: ____________________  What is a fetal movement count? A fetal movement count is the number of times that you feel your baby move during a certain amount of time. This may also be called a fetal kick count. A fetal movement count is recommended for every pregnant woman. You may be asked to start counting fetal movements as early as week 28 of your pregnancy. Pay attention to when your baby is most active. You may notice your baby's sleep and wake cycles. You may also notice things that make your baby move more. You should do a fetal movement count:  When your baby is normally most active.  At the same time each day. A good time to count movements is while you are resting, after having something to eat and drink. How do I count fetal movements? 1. Find a quiet, comfortable area. Sit, or lie down on your side. 2. Write down the date, the start time and stop time, and the number of movements that you felt between those two times. Take this information with you to your health care visits. 3. Write down your start time when you feel the first movement. 4. Count kicks, flutters, swishes, rolls, and jabs. You should feel at least 10 movements. 5. You may stop counting after you have felt 10 movements, or if you have been counting for 2 hours. Write down the stop time. 6. If you do not feel 10 movements in 2 hours, contact your health care provider for further instructions. Your health care provider may want to do additional tests to assess your baby's well-being. Contact a health care provider if:  You feel fewer than 10 movements in 2 hours.  Your baby is not moving like he or she usually does. Date: ____________ Start time: ____________ Stop time: ____________ Movements: ____________ Date: ____________ Start time: ____________ Stop time: ____________ Movements: ____________ Date: ____________  Start time: ____________ Stop time: ____________ Movements: ____________ Date: ____________ Start time: ____________ Stop time: ____________ Movements: ____________ Date: ____________ Start time: ____________ Stop time: ____________ Movements: ____________ Date: ____________ Start time: ____________ Stop time: ____________ Movements: ____________ Date: ____________ Start time: ____________ Stop time: ____________ Movements: ____________ Date: ____________ Start time: ____________ Stop time: ____________ Movements: ____________ Date: ____________ Start time: ____________ Stop time: ____________ Movements: ____________ This information is not intended to replace advice given to you by your health care provider. Make sure you discuss any questions you have with your health care provider. Document Revised: 07/18/2019 Document Reviewed: 07/18/2019 Elsevier Patient Education  2021 Elsevier Inc.    Third Trimester of Pregnancy  The third trimester of pregnancy is from week 28 through week 40. This is also called months 7 through 9. This trimester is when your unborn baby (fetus) is growing very fast. At the end of the ninth month, the unborn baby is about 20 inches long. It weighs about 6-10 pounds. Body changes during your third trimester Your body continues to go through many changes during this time. The changes vary and generally return to normal after the baby is born. Physical changes  Your weight will continue to increase. You may gain 25-35 pounds (11-16 kg) by the end of the pregnancy. If you are underweight, you may gain 28-40 lb (about 13-18 kg). If you are overweight, you may gain 15-25 lb (about 7-11 kg).  You may start to get stretch marks on your hips, belly (abdomen), and breasts.  Your breasts will continue to   grow and may hurt. A yellow fluid (colostrum) may leak from your breasts. This is the first milk you are making for your baby.  You may have changes in your hair.  Your  belly button may stick out.  You may have more swelling in your hands, face, or ankles. Health changes  You may have heartburn.  You may have trouble pooping (constipation).  You may get hemorrhoids. These are swollen veins in the butt that can itch or get painful.  You may have swollen veins (varicose veins) in your legs.  You may have more body aches in the pelvis, back, or thighs.  You may have more tingling or numbness in your hands, arms, and legs. The skin on your belly may also feel numb.  You may feel short of breath as your womb (uterus) gets bigger. Other changes  You may pee (urinate) more often.  You may have more problems sleeping.  You may notice the unborn baby "dropping," or moving lower in your belly.  You may have more discharge coming from your vagina.  Your joints may feel loose, and you may have pain around your pelvic bone. Follow these instructions at home: Medicines  Take over-the-counter and prescription medicines only as told by your doctor. Some medicines are not safe during pregnancy.  Take a prenatal vitamin that contains at least 600 micrograms (mcg) of folic acid. Eating and drinking  Eat healthy meals that include: ? Fresh fruits and vegetables. ? Whole grains. ? Good sources of protein, such as meat, eggs, or tofu. ? Low-fat dairy products.  Avoid raw meat and unpasteurized juice, milk, and cheese. These carry germs that can harm you and your baby.  Eat 4 or 5 small meals rather than 3 large meals a day.  You may need to take these actions to prevent or treat trouble pooping: ? Drink enough fluids to keep your pee (urine) pale yellow. ? Eat foods that are high in fiber. These include beans, whole grains, and fresh fruits and vegetables. ? Limit foods that are high in fat and sugar. These include fried or sweet foods. Activity  Exercise only as told by your doctor. Stop exercising if you start to have cramps in your womb.  Avoid  heavy lifting.  Do not exercise if it is too hot or too humid, or if you are in a place of great height (high altitude).  If you choose to, you may have sex unless your doctor tells you not to. Relieving pain and discomfort  Take breaks often, and rest with your legs raised (elevated) if you have leg cramps or low back pain.  Take warm water baths (sitz baths) to soothe pain or discomfort caused by hemorrhoids. Use hemorrhoid cream if your doctor approves.  Wear a good support bra if your breasts are tender.  If you develop bulging, swollen veins in your legs: ? Wear support hose as told by your doctor. ? Raise your feet for 15 minutes, 3-4 times a day. ? Limit salt in your food. Safety  Talk to your doctor before traveling far distances.  Do not use hot tubs, steam rooms, or saunas.  Wear your seat belt at all times when you are in a car.  Talk with your doctor if someone is hurting you or yelling at you a lot. Preparing for your baby's arrival To prepare for the arrival of your baby:  Take prenatal classes.  Visit the hospital and tour the maternity area.  Buy   a rear-facing car seat. Learn how to install it in your car.  Prepare the baby's room. Take out all pillows and stuffed animals from the baby's crib. General instructions  Avoid cat litter boxes and soil used by cats. These carry germs that can cause harm to the baby and can cause a loss of your baby by miscarriage or stillbirth.  Do not douche or use tampons. Do not use scented sanitary pads.  Do not smoke or use any products that contain nicotine or tobacco. If you need help quitting, ask your doctor.  Do not drink alcohol.  Do not use herbal medicines, illegal drugs, or medicines that were not approved by your doctor. Chemicals in these products can affect your baby.  Keep all follow-up visits. This is important. Where to find more information  American Pregnancy Association:  americanpregnancy.org  American College of Obstetricians and Gynecologists: www.acog.org  Office on Women's Health: womenshealth.gov/pregnancy Contact a doctor if:  You have a fever.  You have mild cramps or pressure in your lower belly.  You have a nagging pain in your belly area.  You vomit, or you have watery poop (diarrhea).  You have bad-smelling fluid coming from your vagina.  You have pain when you pee, or your pee smells bad.  You have a headache that does not go away when you take medicine.  You have changes in how you see, or you see spots in front of your eyes. Get help right away if:  Your water breaks.  You have regular contractions that are less than 5 minutes apart.  You are spotting or bleeding from your vagina.  You have very bad belly cramps or pain.  You have trouble breathing.  You have chest pain.  You faint.  You have not felt the baby move for the amount of time told by your doctor.  You have new or increased pain, swelling, or redness in an arm or leg. Summary  The third trimester is from week 28 through week 40 (months 7 through 9). This is the time when your unborn baby is growing very fast.  During this time, your discomfort may increase as you gain weight and as your baby grows.  Get ready for your baby to arrive by taking prenatal classes, buying a rear-facing car seat, and preparing the baby's room.  Get help right away if you are bleeding from your vagina, you have chest pain and trouble breathing, or you have not felt the baby move for the amount of time told by your doctor. This information is not intended to replace advice given to you by your health care provider. Make sure you discuss any questions you have with your health care provider. Document Revised: 05/06/2020 Document Reviewed: 03/12/2020 Elsevier Patient Education  2021 Elsevier Inc.  

## 2021-01-01 ENCOUNTER — Other Ambulatory Visit: Payer: Self-pay

## 2021-01-01 DIAGNOSIS — I471 Supraventricular tachycardia: Secondary | ICD-10-CM

## 2021-01-13 ENCOUNTER — Other Ambulatory Visit: Payer: Self-pay

## 2021-01-13 ENCOUNTER — Encounter: Payer: Self-pay | Admitting: Certified Nurse Midwife

## 2021-01-13 ENCOUNTER — Ambulatory Visit (INDEPENDENT_AMBULATORY_CARE_PROVIDER_SITE_OTHER): Payer: 59 | Admitting: Certified Nurse Midwife

## 2021-01-13 VITALS — BP 116/66 | HR 87 | Wt 164.6 lb

## 2021-01-13 DIAGNOSIS — Z3A35 35 weeks gestation of pregnancy: Secondary | ICD-10-CM

## 2021-01-13 LAB — POCT URINALYSIS DIPSTICK OB
Bilirubin, UA: NEGATIVE
Blood, UA: NEGATIVE
Glucose, UA: NEGATIVE
Ketones, UA: NEGATIVE
Leukocytes, UA: NEGATIVE
Nitrite, UA: NEGATIVE
POC,PROTEIN,UA: NEGATIVE
Spec Grav, UA: 1.015 (ref 1.010–1.025)
Urobilinogen, UA: 0.2 E.U./dL
pH, UA: 6.5 (ref 5.0–8.0)

## 2021-01-13 NOTE — Patient Instructions (Signed)

## 2021-01-13 NOTE — Progress Notes (Signed)
ROB doing well. Feels good movement. Discussed GBS culture next visit . She verbalizes and agree. Reviewed remainder of PNC. Follow up 1 wk with Marcelino Duster.   Doreene Burke, CNM

## 2021-01-14 ENCOUNTER — Ambulatory Visit (INDEPENDENT_AMBULATORY_CARE_PROVIDER_SITE_OTHER): Payer: Managed Care, Other (non HMO) | Admitting: Cardiology

## 2021-01-14 ENCOUNTER — Encounter: Payer: Self-pay | Admitting: Cardiology

## 2021-01-14 VITALS — BP 110/60 | HR 105 | Ht 61.0 in | Wt 164.2 lb

## 2021-01-14 DIAGNOSIS — Z3A35 35 weeks gestation of pregnancy: Secondary | ICD-10-CM | POA: Diagnosis not present

## 2021-01-14 DIAGNOSIS — I471 Supraventricular tachycardia: Secondary | ICD-10-CM | POA: Diagnosis not present

## 2021-01-14 NOTE — Progress Notes (Signed)
Cardiology Office Note:    Date:  01/14/2021   ID:  Selena White, DOB [birth year], MRN [id number]  PCP:  Patient, No Pcp Per  [facility] Cardiologist:  Debbe Odea, MD  [facility] Electrophysiologist:  None   Referring MD: Debbe Odea, MD   Chief Complaint  Patient presents with  . Other    6 wk f/u c/o palpitations. Meds reviewed verbally with pt.    History of Present Illness:    Selena White is a 30 y.o. female with a hx of irregular heartbeats, currently [redacted] weeks pregnant who presents for follow-up.  Cardiac monitor was placed to evaluate arrhythmias. Results reviewed by myself showing occasional paroxysmal SVT and nonsustained VT lasting 10 beats. Echocardiogram was ordered which was also reviewed by myself showing normal systolic and diastolic function, no gross abnormal findings.  She feels well otherwise, denies dizziness, syncope.  Occasional palpitations still occur.  Her due date is probably later this month or next.  Past Medical History:  Diagnosis Date  . Arrhythmia   . Asthma   . Hand tendonitis   . Heavy periods   . Mixed hypoglycemia   . Overweight   . Painful menstrual periods   . UTI (lower urinary tract infection)     History reviewed. No pertinent surgical history.  Current Medications: Current Meds  Medication Sig  . Doxylamine-Pyridoxine (DICLEGIS) 10-10 MG TBEC Take 2 tablets by mouth at bedtime. If symptoms persist, add one tablet in the morning and one in the afternoon (Patient taking differently: Take 1 tablet by mouth at bedtime. If symptoms persist, add one tablet in the morning and one in the afternoon)  . Iron-FA-B Cmp-C-Biot-Probiotic (FUSION PLUS) CAPS Take 1 tablet by mouth daily.  . Prenatal Vit-Fe Fumarate-FA (PRENATAL MULTIVITAMIN) TABS tablet Take 1 tablet by mouth daily at 12 noon.   Current Facility-Administered Medications for the 01/14/21 encounter (Office Visit) with Debbe Odea, MD  Medication  .  Tdap (BOOSTRIX) injection 0.5 mL     Allergies:   Patient has no known allergies.   Social History   Socioeconomic History  . Marital status: Married    Spouse name: Not on file  . Number of children: Not on file  . Years of education: Not on file  . Highest education level: Not on file  Occupational History    Employer: [employer]  Tobacco Use  . Smoking status: Never Smoker  . Smokeless tobacco: Never Used  Vaping Use  . Vaping Use: Never used  Substance and Sexual Activity  . Alcohol use: No    Alcohol/week: 0.0 standard drinks  . Drug use: No  . Sexual activity: Yes    Partners: Male    Birth control/protection: None  Other Topics Concern  . Not on file  Social History Narrative  . Not on file   Social Determinants of Health   Financial Resource Strain: Not on file  Food Insecurity: Not on file  Transportation Needs: Not on file  Physical Activity: Not on file  Stress: Not on file  Social Connections: Not on file     Family History: The patient's family history includes Cancer in her maternal grandmother; Diabetes in her father and maternal grandmother; Heart disease in her father; Heart failure in her father; Hypertension in her father; Hypothyroidism in her mother.  ROS:   Please see the history of present illness.     All other systems reviewed and are negative.  EKGs/Labs/Other Studies Reviewed:  The following studies were reviewed today:   EKG:  EKG is  ordered today.  The ekg ordered today demonstrates sinus tachycardia, otherwise normal ECG  Recent Labs: 04/02/2020: TSH 2.090 08/26/2020: ALT 12; BUN 6; Creatinine, Ser 0.46; Potassium 4.0; Sodium 136 12/16/2020: Hemoglobin 10.6; Platelets 162  Recent Lipid Panel No results found for: CHOL, TRIG, HDL, CHOLHDL, VLDL, LDLCALC, LDLDIRECT   Risk Assessment/Calculations:      Physical Exam:    VS:  BP 110/60 (BP Location: Left Arm, Patient Position: Sitting, Cuff Size:  Normal)   Pulse (!) 105   Ht 5\' 1"  (1.549 m)   Wt 164 lb 4 oz (74.5 kg)   LMP 05/13/2020 (Exact Date)   SpO2 98%   BMI 31.03 kg/m     Wt Readings from Last 3 Encounters:  01/14/21 164 lb 4 oz (74.5 kg)  01/13/21 164 lb 9 oz (74.6 kg)  12/31/20 161 lb 6 oz (73.2 kg)     GEN:  Well nourished, well developed in no acute distress HEENT: Normal NECK: No JVD; No carotid bruits LYMPHATICS: No lymphadenopathy CARDIAC: RRR, no murmurs, rubs, gallops RESPIRATORY:  Clear to auscultation without rales, wheezing or rhonchi  ABDOMEN: Soft, non-tender, distended MUSCULOSKELETAL:  No edema; No deformity  SKIN: Warm and dry NEUROLOGIC:  Alert and oriented x 3 PSYCHIATRIC:  Normal affect   ASSESSMENT:    1. PSVT (paroxysmal supraventricular tachycardia) (HCC)   2. [redacted] weeks gestation of pregnancy    PLAN:    In order of problems listed above:  1. Palpitations, cardiac monitor showing paroxysmal SVT, nonsustained VT lasting 10 beats. Echocardiogram showing normal systolic and diastolic function, EF 55 to 60%, no gross abnormal structural findings.  Due to patient being pregnant, and no gross abnormal findings on echo noted, will hold off on additional meds such beta-blocker at this time.  Will have patient see EP for additional input.  We will see if symptoms improve after delivery, as arrhythmias could be secondary to increased volume/cardiac stretching associated with pregnancy. 2. 35 weeks pregnancy, keep appointment with OB.  Follow-up in 2 months   Medication Adjustments/Labs and Tests Ordered: Current medicines are reviewed at length with the patient today.  Concerns regarding medicines are outlined above.  No orders of the defined types were placed in this encounter.  No orders of the defined types were placed in this encounter.   Patient Instructions  Medication Instructions:  Your physician recommends that you continue on your current medications as directed. Please refer to  the Current Medication list given to you today.  *If you need a refill on your cardiac medications before your next appointment, please call your pharmacy*   Lab Work: None Ordered If you have labs (blood work) drawn today and your tests are completely normal, you will receive your results only by: 01/02/21 MyChart Message (if you have MyChart) OR . A paper copy in the mail If you have any lab test that is abnormal or we need to change your treatment, we will call you to review the results.   Testing/Procedures: None Ordered   Follow-Up: At [facility], you and your health needs are our priority.  As part of our continuing mission to provide you with exceptional heart care, we have created designated Provider Care Teams.  These Care Teams include your primary Cardiologist (physician) and Advanced Practice Providers (APPs -  Physician Assistants and Nurse Practitioners) who all work together to provide you with the care you need, when you  need it.  We recommend signing up for the patient portal called "MyChart".  Sign up information is provided on this After Visit Summary.  MyChart is used to connect with patients for Virtual Visits (Telemedicine).  Patients are able to view lab/test results, encounter notes, upcoming appointments, etc.  Non-urgent messages can be sent to your provider as well.   To learn more about what you can do with MyChart, go to [email or web address].    Your next appointment:   2 month(s)  The format for your next appointment:   In Person  Provider:   Debbe Odea, MD   Other Instructions      Signed, Debbe Odea, MD  01/14/2021 1:10 PM    [facility]

## 2021-01-14 NOTE — Patient Instructions (Signed)
Medication Instructions:  °Your physician recommends that you continue on your current medications as directed. Please refer to the Current Medication list given to you today. ° °*If you need a refill on your cardiac medications before your next appointment, please call your pharmacy* ° ° °Lab Work: °None Ordered °If you have labs (blood work) drawn today and your tests are completely normal, you will receive your results only by: °• MyChart Message (if you have MyChart) OR °• A paper copy in the mail °If you have any lab test that is abnormal or we need to change your treatment, we will call you to review the results. ° ° °Testing/Procedures: °None Ordered ° ° °Follow-Up: °At [facility], you and your health needs are our priority.  As part of our continuing mission to provide you with exceptional heart care, we have created designated Provider Care Teams.  These Care Teams include your primary Cardiologist (physician) and Advanced Practice Providers (APPs -  Physician Assistants and Nurse Practitioners) who all work together to provide you with the care you need, when you need it. ° °We recommend signing up for the patient portal called "MyChart".  Sign up information is provided on this After Visit Summary.  MyChart is used to connect with patients for Virtual Visits (Telemedicine).  Patients are able to view lab/test results, encounter notes, upcoming appointments, etc.  Non-urgent messages can be sent to your provider as well.   °To learn more about what you can do with MyChart, go to [email or web address].   ° °Your next appointment:   °2 month(s) ° °The format for your next appointment:   °In Person ° °Provider:   °Brian Agbor-Etang, MD ° ° °Other Instructions ° ° °

## 2021-01-20 ENCOUNTER — Other Ambulatory Visit: Payer: Self-pay

## 2021-01-20 ENCOUNTER — Ambulatory Visit (INDEPENDENT_AMBULATORY_CARE_PROVIDER_SITE_OTHER): Payer: Managed Care, Other (non HMO) | Admitting: Cardiology

## 2021-01-20 ENCOUNTER — Encounter: Payer: Self-pay | Admitting: Cardiology

## 2021-01-20 VITALS — BP 120/78 | HR 91 | Ht 61.0 in | Wt 167.2 lb

## 2021-01-20 DIAGNOSIS — I471 Supraventricular tachycardia: Secondary | ICD-10-CM | POA: Diagnosis not present

## 2021-01-20 DIAGNOSIS — R002 Palpitations: Secondary | ICD-10-CM | POA: Diagnosis not present

## 2021-01-20 DIAGNOSIS — I4729 Other ventricular tachycardia: Secondary | ICD-10-CM

## 2021-01-20 DIAGNOSIS — I472 Ventricular tachycardia: Secondary | ICD-10-CM

## 2021-01-20 NOTE — Progress Notes (Signed)
Electrophysiology Office Note:    Date:  01/20/2021   ID:  Selena White, DOB Jun 06, 1991, MRN [id number]  PCP:  Patient, No Pcp Per  [facility] Cardiologist:  Debbe Odea, MD  [facility] Electrophysiologist:  None   Referring MD: Debbe Odea, MD   Chief Complaint: Palpitations  History of Present Illness:    Selena White is a 30 y.o. female who presents for an evaluation of palpitations at the request of Dr. Azucena Cecil. Their medical history includes asthma and current pregnancy.  Patient last saw Dr. Azucena Cecil on January 14, 2021.  At that time she was [redacted] weeks pregnant.  She tells me her history of palpitation goes back 6 years ago when she was first evaluated by [facility].  When she feels her palpitations they feel like skips and flips in her chest.  No sustained arrhythmias.  At that time she wore a monitor and was started on a beta-blocker which helped her symptoms.  She tells me that during pregnancy the palpitations have increased in frequency especially since stopping the beta-blocker.  Her family history is significant for her father passing away in his 38s from heart failure in her paternal grandfather also having heart disease.  She denies syncope or presyncope.  Past Medical History:  Diagnosis Date  . Arrhythmia   . Asthma   . Hand tendonitis   . Heavy periods   . Mixed hypoglycemia   . Overweight   . Painful menstrual periods   . UTI (lower urinary tract infection)     History reviewed. No pertinent surgical history.  Current Medications: Current Meds  Medication Sig  . Doxylamine-Pyridoxine (DICLEGIS) 10-10 MG TBEC Take 2 tablets by mouth at bedtime. If symptoms persist, add one tablet in the morning and one in the afternoon (Patient taking differently: Take 1 tablet by mouth at bedtime. If symptoms persist, add one tablet in the morning and one in the afternoon)  . Iron-FA-B Cmp-C-Biot-Probiotic (FUSION PLUS) CAPS Take 1 tablet by  mouth daily.  . Prenatal Vit-Fe Fumarate-FA (PRENATAL MULTIVITAMIN) TABS tablet Take 1 tablet by mouth daily at 12 noon.   Current Facility-Administered Medications for the 01/20/21 encounter (Office Visit) with Lanier Prude, MD  Medication  . Tdap (BOOSTRIX) injection 0.5 mL     Allergies:   Patient has no known allergies.   Social History   Socioeconomic History  . Marital status: Married    Spouse name: Not on file  . Number of children: Not on file  . Years of education: Not on file  . Highest education level: Not on file  Occupational History    Employer: [employer]  Tobacco Use  . Smoking status: Never Smoker  . Smokeless tobacco: Never Used  Vaping Use  . Vaping Use: Never used  Substance and Sexual Activity  . Alcohol use: No    Alcohol/week: 0.0 standard drinks  . Drug use: No  . Sexual activity: Yes    Partners: Male    Birth control/protection: None  Other Topics Concern  . Not on file  Social History Narrative  . Not on file   Social Determinants of Health   Financial Resource Strain: Not on file  Food Insecurity: Not on file  Transportation Needs: Not on file  Physical Activity: Not on file  Stress: Not on file  Social Connections: Not on file     Family History: The patient's family history includes Cancer in her maternal grandmother; Diabetes in her father  and maternal grandmother; Heart disease in her father; Heart failure in her father; Hypertension in her father; Hypothyroidism in her mother.  ROS:   Please see the history of present illness.    All other systems reviewed and are negative.  EKGs/Labs/Other Studies Reviewed:    The following studies were reviewed today:  December 30, 2020 echo personally reviewed Left ventricular function normal, 55% Right ventricular function normal No significant valvular disease.   December 31, 2020 ZIO personally reviewed     Rare supraventricular and ventricular ectopy.   There is one episode nonsustained VT lasting about 10 beats.  Another episode of SVT lasting about 7 seconds at a rate of 210 bpm.  Many of her patient triggered events corresponded to isolated PVCs.  EKG:  The ekg ordered today demonstrates sinus rhythm  Recent Labs: 04/02/2020: TSH 2.090 08/26/2020: ALT 12; BUN 6; Creatinine, Ser 0.46; Potassium 4.0; Sodium 136 12/16/2020: Hemoglobin 10.6; Platelets 162  Recent Lipid Panel No results found for: CHOL, TRIG, HDL, CHOLHDL, VLDL, LDLCALC, LDLDIRECT  Physical Exam:    VS:  BP 120/78 (BP Location: Left Arm, Patient Position: Sitting, Cuff Size: Normal)   Pulse 91   Ht 5\' 1"  (1.549 m)   Wt 167 lb 4 oz (75.9 kg)   LMP 05/13/2020 (Exact Date)   SpO2 99%   BMI 31.60 kg/m     Wt Readings from Last 3 Encounters:  01/20/21 167 lb 4 oz (75.9 kg)  01/14/21 164 lb 4 oz (74.5 kg)  01/13/21 164 lb 9 oz (74.6 kg)     GEN:  Well nourished, well developed in no acute distress HEENT: Normal NECK: No JVD; No carotid bruits LYMPHATICS: No lymphadenopathy CARDIAC: RRR, no murmurs, rubs, gallops RESPIRATORY:  Clear to auscultation without rales, wheezing or rhonchi  ABDOMEN: Soft, non-tender, non-distended. [redacted]weeks pregnant MUSCULOSKELETAL:  No edema; No deformity  SKIN: Warm and dry NEUROLOGIC:  Alert and oriented x 3 PSYCHIATRIC:  Normal affect   ASSESSMENT:    1. PSVT (paroxysmal supraventricular tachycardia) (HCC)   2. NSVT (nonsustained ventricular tachycardia) (HCC)   3. Palpitations    PLAN:    In order of problems listed above:  1. Palpitations, NSVT and PSVT Symptomatic.  ZIO monitor shows few episodes of SVT and NSVT that are nonsustained.  Most for patient triggered events correspond to isolated PVCs.  I suspect most of her palpitations are actually related to symptomatic PVCs.  The etiology of her arrhythmias are likely due to increased blood volume and increased stretch on the atrial and ventricular walls.   Given her family  history of premature cardiovascular disease, we decided to touch base about 3 months after she has delivered.  At that appointment, we will plan to repeat a 2-week ZIO monitor to reassess the burden of supraventricular and ventricular arrhythmia.  At that appointment we can also revisit the beta-blocker and whether or not we should restart it.   Medication Adjustments/Labs and Tests Ordered: Current medicines are reviewed at length with the patient today.  Concerns regarding medicines are outlined above.  Orders Placed This Encounter  Procedures  . EKG 12-Lead   No orders of the defined types were placed in this encounter.    Signed, 03/13/21, MD, [facility] [place]  01/20/2021 2:03 PM    Electrophysiology [facility]

## 2021-01-20 NOTE — Addendum Note (Signed)
Addended by: Festus Aloe on: 01/20/2021 03:11 PM   Modules accepted: Orders

## 2021-01-20 NOTE — Patient Instructions (Addendum)
Medication Instructions:  - Your physician recommends that you continue on your current medications as directed. Please refer to the Current Medication list given to you today.  *If you need a refill on your cardiac medications before your next appointment, please call your pharmacy*   Lab Work: - none ordered  If you have labs (blood work) drawn today and your tests are completely normal, you will receive your results only by: Marland Kitchen MyChart Message (if you have MyChart) OR . A paper copy in the mail If you have any lab test that is abnormal or we need to change your treatment, we will call you to review the results.   Testing/Procedures: - none ordered   Follow-Up: At [facility], you and your health needs are our priority.  As part of our continuing mission to provide you with exceptional heart care, we have created designated Provider Care Teams.  These Care Teams include your primary Cardiologist (physician) and Advanced Practice Providers (APPs -  Physician Assistants and Nurse Practitioners) who all work together to provide you with the care you need, when you need it.  We recommend signing up for the patient portal called "MyChart".  Sign up information is provided on this After Visit Summary.  MyChart is used to connect with patients for Virtual Visits (Telemedicine).  Patients are able to view lab/test results, encounter notes, upcoming appointments, etc.  Non-urgent messages can be sent to your provider as well.   To learn more about what you can do with MyChart, go to [email or web address].    Your next appointment:   4 month(s)  The format for your next appointment:   In Person  Provider:   Steffanie Dunn, MD   Other Instructions n/a

## 2021-01-22 ENCOUNTER — Encounter: Payer: Self-pay | Admitting: Certified Nurse Midwife

## 2021-01-22 ENCOUNTER — Other Ambulatory Visit: Payer: Self-pay

## 2021-01-22 ENCOUNTER — Ambulatory Visit (INDEPENDENT_AMBULATORY_CARE_PROVIDER_SITE_OTHER): Payer: 59 | Admitting: Certified Nurse Midwife

## 2021-01-22 VITALS — BP 98/74 | HR 99 | Wt 167.7 lb

## 2021-01-22 DIAGNOSIS — Z3685 Encounter for antenatal screening for Streptococcus B: Secondary | ICD-10-CM

## 2021-01-22 DIAGNOSIS — Z113 Encounter for screening for infections with a predominantly sexual mode of transmission: Secondary | ICD-10-CM

## 2021-01-22 DIAGNOSIS — Z3403 Encounter for supervision of normal first pregnancy, third trimester: Secondary | ICD-10-CM

## 2021-01-22 DIAGNOSIS — Z3A36 36 weeks gestation of pregnancy: Secondary | ICD-10-CM

## 2021-01-22 LAB — POCT URINALYSIS DIPSTICK OB
Bilirubin, UA: NEGATIVE
Blood, UA: NEGATIVE
Glucose, UA: NEGATIVE
Ketones, UA: NEGATIVE
Nitrite, UA: NEGATIVE
Spec Grav, UA: 1.02 (ref 1.010–1.025)
Urobilinogen, UA: 0.2 E.U./dL
pH, UA: 6.5 (ref 5.0–8.0)

## 2021-01-22 NOTE — Patient Instructions (Signed)
Vaginal Delivery  Vaginal delivery means that you give birth by pushing your baby out of your birth canal (vagina). A team of health care providers will help you before, during, and after vaginal delivery. Birth experiences are unique for every woman and every pregnancy, and birth experiences vary depending on where you choose to give birth. What happens when I arrive at the birth center or hospital? Once you are in labor and have been admitted into the hospital or birth center, your health care provider may:  Review your pregnancy history and any concerns that you have.  Insert an IV into one of your veins. This may be used to give you fluids and medicines.  Check your blood pressure, pulse, temperature, and heart rate (vital signs).  Check whether your bag of water (amniotic sac) has broken (ruptured).  Talk with you about your birth plan and discuss pain control options. Monitoring Your health care provider may monitor your contractions (uterine monitoring) and your baby's heart rate (fetal monitoring). You may need to be monitored:  Often, but not continuously (intermittently).  All the time or for long periods at a time (continuously). Continuous monitoring may be needed if: ? You are taking certain medicines, such as medicine to relieve pain or make your contractions stronger. ? You have pregnancy or labor complications. Monitoring may be done by:  Placing a special stethoscope or a handheld monitoring device on your abdomen to check your baby's heartbeat and to check for contractions.  Placing monitors on your abdomen (external monitors) to record your baby's heartbeat and the frequency and length of contractions.  Placing monitors inside your uterus through your vagina (internal monitors) to record your baby's heartbeat and the frequency, length, and strength of your contractions. Depending on the type of monitor, it may remain in your uterus or on your baby's head until  birth.  Telemetry. This is a type of continuous monitoring that can be done with external or internal monitors. Instead of having to stay in bed, you are able to move around during telemetry. Physical exam Your health care provider may perform frequent physical exams. This may include:  Checking how and where your baby is positioned in your uterus.  Checking your cervix to determine: ? Whether it is thinning out (effacing). ? Whether it is opening up (dilating). What happens during labor and delivery? Normal labor and delivery is divided into the following three stages: Stage 1  This is the longest stage of labor.  This stage can last for hours or days.  Throughout this stage, you will feel contractions. Contractions generally feel mild, infrequent, and irregular at first. They get stronger, more frequent (about every 2-3 minutes), and more regular as you move through this stage.  This stage ends when your cervix is completely dilated to 4 inches (10 cm) and completely effaced. Stage 2  This stage starts once your cervix is completely effaced and dilated and lasts until the delivery of your baby.  This stage may last from 20 minutes to 2 hours.  This is the stage where you will feel an urge to push your baby out of your vagina.  You may feel stretching and burning pain, especially when the widest part of your baby's head passes through the vaginal opening (crowning).  Once your baby is delivered, the umbilical cord will be clamped and cut. This usually occurs after waiting a period of 1-2 minutes after delivery.  Your baby will be placed on your bare chest (  contact) in an upright position and covered with a warm blanket. Watch your baby for feeding cues, like rooting or sucking, and help the baby to your breast for his or her first feeding. Stage 3  This stage starts immediately after the birth of your baby and ends after you deliver the placenta.  This stage may  take anywhere from 5 to 30 minutes.  After your baby has been delivered, you will feel contractions as your body expels the placenta and your uterus contracts to control bleeding.   What can I expect after labor and delivery?  After labor is over, you and your baby will be monitored closely until you are ready to go home to ensure that you are both healthy. Your health care team will teach you how to care for yourself and your baby.  You and your baby will stay in the same room (rooming in) during your hospital stay. This will encourage early bonding and successful breastfeeding.  You may continue to receive fluids and medicines through an IV.  Your uterus will be checked and massaged regularly (fundal massage).  You will have some soreness and pain in your abdomen, vagina, and the area of skin between your vaginal opening and your anus (perineum).  If an incision was made near your vagina (episiotomy) or if you had some vaginal tearing during delivery, cold compresses may be placed on your episiotomy or your tear. This helps to reduce pain and swelling.  You may be given a squirt bottle to use instead of wiping when you go to the bathroom. To use the squirt bottle, follow these steps: ? Before you urinate, fill the squirt bottle with warm water. Do not use hot water. ? After you urinate, while you are sitting on the toilet, use the squirt bottle to rinse the area around your urethra and vaginal opening. This rinses away any urine and blood. ? Fill the squirt bottle with clean water every time you use the bathroom.  It is normal to have vaginal bleeding after delivery. Wear a sanitary pad for vaginal bleeding and discharge. Summary  Vaginal delivery means that you will give birth by pushing your baby out of your birth canal (vagina).  Your health care provider may monitor your contractions (uterine monitoring) and your baby's heart rate (fetal monitoring).  Your health care provider may  perform a physical exam.  Normal labor and delivery is divided into three stages.  After labor is over, you and your baby will be monitored closely until you are ready to go home. This information is not intended to replace advice given to you by your health care provider. Make sure you discuss any questions you have with your health care provider. Document Revised: 01/02/2018 Document Reviewed: 01/02/2018 Elsevier Patient Education  2021 Elsevier Inc.    Fetal Movement Counts Patient Name: ________________________________________________ Patient Due Date: ____________________  What is a fetal movement count? A fetal movement count is the number of times that you feel your baby move during a certain amount of time. This may also be called a fetal kick count. A fetal movement count is recommended for every pregnant woman. You may be asked to start counting fetal movements as early as week 28 of your pregnancy. Pay attention to when your baby is most active. You may notice your baby's sleep and wake cycles. You may also notice things that make your baby move more. You should do a fetal movement count:  When your baby is normally   most active.  At the same time each day. A good time to count movements is while you are resting, after having something to eat and drink. How do I count fetal movements? 1. Find a quiet, comfortable area. Sit, or lie down on your side. 2. Write down the date, the start time and stop time, and the number of movements that you felt between those two times. Take this information with you to your health care visits. 3. Write down your start time when you feel the first movement. 4. Count kicks, flutters, swishes, rolls, and jabs. You should feel at least 10 movements. 5. You may stop counting after you have felt 10 movements, or if you have been counting for 2 hours. Write down the stop time. 6. If you do not feel 10 movements in 2 hours, contact your health care  provider for further instructions. Your health care provider may want to do additional tests to assess your baby's well-being. Contact a health care provider if:  You feel fewer than 10 movements in 2 hours.  Your baby is not moving like he or she usually does. Date: ____________ Start time: ____________ Stop time: ____________ Movements: ____________ Date: ____________ Start time: ____________ Stop time: ____________ Movements: ____________ Date: ____________ Start time: ____________ Stop time: ____________ Movements: ____________ Date: ____________ Start time: ____________ Stop time: ____________ Movements: ____________ Date: ____________ Start time: ____________ Stop time: ____________ Movements: ____________ Date: ____________ Start time: ____________ Stop time: ____________ Movements: ____________ Date: ____________ Start time: ____________ Stop time: ____________ Movements: ____________ Date: ____________ Start time: ____________ Stop time: ____________ Movements: ____________ Date: ____________ Start time: ____________ Stop time: ____________ Movements: ____________ This information is not intended to replace advice given to you by your health care provider. Make sure you discuss any questions you have with your health care provider. Document Revised: 07/18/2019 Document Reviewed: 07/18/2019 Elsevier Patient Education  2021 Elsevier Inc.  

## 2021-01-22 NOTE — Progress Notes (Signed)
She has no concerns today.

## 2021-01-24 LAB — STREP GP B NAA: Strep Gp B NAA: NEGATIVE

## 2021-01-25 LAB — GC/CHLAMYDIA PROBE AMP
Chlamydia trachomatis, NAA: NEGATIVE
Neisseria Gonorrhoeae by PCR: NEGATIVE

## 2021-01-25 NOTE — Progress Notes (Signed)
ROB-Doing well, no questions or concerns. 36 week cultures collected, see orders. Requests SVE. Pre-labor checklist, herbal prep guide, and birth affirmations given. Anticipatory guidance regarding course of prenatal care. Reviewed red flag symptoms and when to call. RTC x 1 week for ROB or sooner if needed.

## 2021-01-27 ENCOUNTER — Other Ambulatory Visit: Payer: Self-pay

## 2021-01-27 ENCOUNTER — Encounter: Payer: Self-pay | Admitting: Certified Nurse Midwife

## 2021-01-27 ENCOUNTER — Ambulatory Visit (INDEPENDENT_AMBULATORY_CARE_PROVIDER_SITE_OTHER): Payer: 59 | Admitting: Certified Nurse Midwife

## 2021-01-27 VITALS — BP 106/78 | HR 79 | Wt 168.2 lb

## 2021-01-27 DIAGNOSIS — Z3A37 37 weeks gestation of pregnancy: Secondary | ICD-10-CM

## 2021-01-27 LAB — POCT URINALYSIS DIPSTICK OB
Bilirubin, UA: NEGATIVE
Glucose, UA: NEGATIVE
Ketones, UA: NEGATIVE
Nitrite, UA: NEGATIVE
Spec Grav, UA: 1.01 (ref 1.010–1.025)
Urobilinogen, UA: 0.2 E.U./dL
pH, UA: 6 (ref 5.0–8.0)

## 2021-01-27 NOTE — Progress Notes (Signed)
ROB doing well, feels good movement. Has had some mild GI uset. Self help measure , po hydration, medication list discussed. Red flags symptoms reviewed. . Reviewed. Labor precautions reviewed. Follow up 1 wk with Marcelino Duster.   Doreene Burke, CNM

## 2021-01-27 NOTE — Patient Instructions (Signed)

## 2021-01-29 ENCOUNTER — Inpatient Hospital Stay
Admission: EM | Admit: 2021-01-29 | Discharge: 2021-01-30 | DRG: 807 | Disposition: A | Discharge status: Home / Self Care | Payer: Managed Care, Other (non HMO) | Attending: Certified Nurse Midwife | Admitting: Certified Nurse Midwife

## 2021-01-29 ENCOUNTER — Encounter: Payer: Self-pay | Admitting: Certified Nurse Midwife

## 2021-01-29 ENCOUNTER — Other Ambulatory Visit: Payer: Self-pay

## 2021-01-29 ENCOUNTER — Ambulatory Visit (INDEPENDENT_AMBULATORY_CARE_PROVIDER_SITE_OTHER): Payer: 59 | Admitting: Certified Nurse Midwife

## 2021-01-29 ENCOUNTER — Encounter: Payer: Self-pay | Admitting: Obstetrics and Gynecology

## 2021-01-29 VITALS — BP 113/84 | HR 101 | Wt 169.0 lb

## 2021-01-29 DIAGNOSIS — O9902 Anemia complicating childbirth: Secondary | ICD-10-CM | POA: Diagnosis present

## 2021-01-29 DIAGNOSIS — O26893 Other specified pregnancy related conditions, third trimester: Secondary | ICD-10-CM | POA: Diagnosis present

## 2021-01-29 DIAGNOSIS — Z3A37 37 weeks gestation of pregnancy: Secondary | ICD-10-CM

## 2021-01-29 DIAGNOSIS — D649 Anemia, unspecified: Secondary | ICD-10-CM | POA: Diagnosis present

## 2021-01-29 DIAGNOSIS — Z3480 Encounter for supervision of other normal pregnancy, unspecified trimester: Secondary | ICD-10-CM

## 2021-01-29 DIAGNOSIS — Z8616 Personal history of COVID-19: Secondary | ICD-10-CM

## 2021-01-29 DIAGNOSIS — Z3483 Encounter for supervision of other normal pregnancy, third trimester: Secondary | ICD-10-CM

## 2021-01-29 LAB — CBC
HCT: 38.1 % (ref 36.0–46.0)
Hemoglobin: 12.5 g/dL (ref 12.0–15.0)
MCH: 29.3 pg (ref 26.0–34.0)
MCHC: 32.8 g/dL (ref 30.0–36.0)
MCV: 89.2 fL (ref 80.0–100.0)
Platelets: 140 10*3/uL — ABNORMAL LOW (ref 150–400)
RBC: 4.27 MIL/uL (ref 3.87–5.11)
RDW: 17.8 % — ABNORMAL HIGH (ref 11.5–15.5)
WBC: 9.2 10*3/uL (ref 4.0–10.5)
nRBC: 0 % (ref 0.0–0.2)

## 2021-01-29 LAB — POCT URINALYSIS DIPSTICK OB
Bilirubin, UA: NEGATIVE
Glucose, UA: NEGATIVE
Leukocytes, UA: NEGATIVE
Nitrite, UA: NEGATIVE
Spec Grav, UA: 1.025 (ref 1.010–1.025)
Urobilinogen, UA: 1 E.U./dL
pH, UA: 6 (ref 5.0–8.0)

## 2021-01-29 LAB — TYPE AND SCREEN
ABO/RH(D): A POS
Antibody Screen: NEGATIVE

## 2021-01-29 MED ORDER — OXYTOCIN 10 UNIT/ML IJ SOLN
INTRAMUSCULAR | Status: AC
  Filled 2021-01-29: qty 2

## 2021-01-29 MED ORDER — LACTATED RINGERS IV SOLN: 500.0000 mL | INTRAVENOUS | Status: DC | PRN

## 2021-01-29 MED ORDER — MISOPROSTOL 200 MCG PO TABS
ORAL_TABLET | ORAL | Status: AC
  Filled 2021-01-29: qty 4

## 2021-01-29 MED ORDER — METHYLERGONOVINE MALEATE 0.2 MG PO TABS: 0.2000 mg | ORAL_TABLET | ORAL | Status: DC | PRN

## 2021-01-29 MED ORDER — OXYTOCIN-SODIUM CHLORIDE 30-0.9 UT/500ML-% IV SOLN
INTRAVENOUS | Status: AC
  Filled 2021-01-29: qty 1000

## 2021-01-29 MED ORDER — LIDOCAINE HCL (PF) 1 % IJ SOLN
INTRAMUSCULAR | Status: AC
  Filled 2021-01-29: qty 30

## 2021-01-29 MED ORDER — COCONUT OIL OIL
1.0000 "application " | TOPICAL_OIL | Status: DC | PRN
  Administered 2021-01-30: 1 via TOPICAL
  Filled 2021-01-29: qty 120

## 2021-01-29 MED ORDER — IBUPROFEN 600 MG PO TABS
ORAL_TABLET | ORAL | Status: AC
  Filled 2021-01-29: qty 1

## 2021-01-29 MED ORDER — OXYTOCIN-SODIUM CHLORIDE 30-0.9 UT/500ML-% IV SOLN: 2.5000 [IU]/h | INTRAVENOUS | Status: DC

## 2021-01-29 MED ORDER — SODIUM CHLORIDE 0.9% FLUSH: 3.0000 mL | Freq: Two times a day (BID) | INTRAVENOUS | Status: DC

## 2021-01-29 MED ORDER — BUTORPHANOL TARTRATE 1 MG/ML IJ SOLN
1.0000 mg | INTRAMUSCULAR | Status: DC | PRN
Start: 2021-01-29 — End: 2021-01-29

## 2021-01-29 MED ORDER — OXYCODONE HCL 5 MG PO TABS: 5.0000 mg | ORAL_TABLET | ORAL | Status: DC | PRN

## 2021-01-29 MED ORDER — SODIUM CHLORIDE 0.9% FLUSH: 3.0000 mL | INTRAVENOUS | Status: DC | PRN

## 2021-01-29 MED ORDER — BENZOCAINE-MENTHOL 20-0.5 % EX AERO
1.0000 "application " | INHALATION_SPRAY | CUTANEOUS | Status: DC | PRN
  Filled 2021-01-29 (×2): qty 56

## 2021-01-29 MED ORDER — SOD CITRATE-CITRIC ACID 500-334 MG/5ML PO SOLN: 30.0000 mL | ORAL | Status: DC | PRN

## 2021-01-29 MED ORDER — WITCH HAZEL-GLYCERIN EX PADS
1.0000 "application " | MEDICATED_PAD | CUTANEOUS | Status: DC | PRN
  Filled 2021-01-29: qty 100

## 2021-01-29 MED ORDER — ONDANSETRON HCL 4 MG/2ML IJ SOLN: 4.0000 mg | Freq: Four times a day (QID) | INTRAMUSCULAR | Status: DC | PRN

## 2021-01-29 MED ORDER — DIPHENHYDRAMINE HCL 25 MG PO CAPS: 25.0000 mg | ORAL_CAPSULE | Freq: Four times a day (QID) | ORAL | Status: DC | PRN

## 2021-01-29 MED ORDER — METHYLERGONOVINE MALEATE 0.2 MG/ML IJ SOLN: 0.2000 mg | INTRAMUSCULAR | Status: DC | PRN

## 2021-01-29 MED ORDER — SIMETHICONE 80 MG PO CHEW
80.0000 mg | CHEWABLE_TABLET | ORAL | Status: DC | PRN
  Administered 2021-01-30: 80 mg via ORAL
  Filled 2021-01-29: qty 1

## 2021-01-29 MED ORDER — OXYCODONE HCL 5 MG PO TABS: 10.0000 mg | ORAL_TABLET | ORAL | Status: DC | PRN

## 2021-01-29 MED ORDER — LACTATED RINGERS IV SOLN: INTRAVENOUS | Status: DC

## 2021-01-29 MED ORDER — LIDOCAINE HCL (PF) 1 % IJ SOLN
30.0000 mL | INTRAMUSCULAR | Status: AC | PRN
  Administered 2021-01-29: 30 mL via SUBCUTANEOUS

## 2021-01-29 MED ORDER — ACETAMINOPHEN 500 MG PO TABS
ORAL_TABLET | ORAL | Status: AC
  Administered 2021-01-30: 1000 mg via ORAL
  Filled 2021-01-29: qty 2

## 2021-01-29 MED ORDER — ONDANSETRON HCL 4 MG/2ML IJ SOLN: 4.0000 mg | INTRAMUSCULAR | Status: DC | PRN

## 2021-01-29 MED ORDER — ACETAMINOPHEN 500 MG PO TABS
1000.0000 mg | ORAL_TABLET | Freq: Four times a day (QID) | ORAL | Status: DC | PRN
  Administered 2021-01-29 – 2021-01-30 (×3): 1000 mg via ORAL
  Filled 2021-01-29 (×3): qty 2

## 2021-01-29 MED ORDER — IBUPROFEN 600 MG PO TABS
600.0000 mg | ORAL_TABLET | Freq: Four times a day (QID) | ORAL | Status: DC
  Administered 2021-01-29 – 2021-01-30 (×2): 600 mg via ORAL
  Filled 2021-01-29: qty 1

## 2021-01-29 MED ORDER — ONDANSETRON HCL 4 MG PO TABS: 4.0000 mg | ORAL_TABLET | ORAL | Status: DC | PRN

## 2021-01-29 MED ORDER — SODIUM CHLORIDE 0.9 % IV SOLN: 250.0000 mL | INTRAVENOUS | Status: DC | PRN

## 2021-01-29 MED ORDER — OXYCODONE-ACETAMINOPHEN 5-325 MG PO TABS: 2.0000 | ORAL_TABLET | ORAL | Status: DC | PRN

## 2021-01-29 MED ORDER — OXYTOCIN BOLUS FROM INFUSION
333.0000 mL | Freq: Once | INTRAVENOUS | Status: AC
  Administered 2021-01-29: 333 mL via INTRAVENOUS

## 2021-01-29 MED ORDER — PRENATAL MULTIVITAMIN CH
1.0000 | ORAL_TABLET | Freq: Every day | ORAL | Status: DC
  Administered 2021-01-30: 1 via ORAL
  Filled 2021-01-29: qty 1

## 2021-01-29 MED ORDER — AMMONIA AROMATIC IN INHA
RESPIRATORY_TRACT | Status: AC
  Filled 2021-01-29: qty 10

## 2021-01-29 MED ORDER — ACETAMINOPHEN 325 MG PO TABS: 650.0000 mg | ORAL_TABLET | ORAL | Status: DC | PRN

## 2021-01-29 MED ORDER — SENNOSIDES-DOCUSATE SODIUM 8.6-50 MG PO TABS
2.0000 | ORAL_TABLET | ORAL | Status: DC
  Administered 2021-01-30: 2 via ORAL
  Filled 2021-01-29: qty 2

## 2021-01-29 MED ORDER — DIBUCAINE (PERIANAL) 1 % EX OINT
1.0000 "application " | TOPICAL_OINTMENT | CUTANEOUS | Status: DC | PRN
  Filled 2021-01-29: qty 28

## 2021-01-29 MED ORDER — OXYCODONE-ACETAMINOPHEN 5-325 MG PO TABS: 1.0000 | ORAL_TABLET | ORAL | Status: DC | PRN

## 2021-01-29 NOTE — Patient Instructions (Signed)

## 2021-01-29 NOTE — H&P (Signed)
Obstetric History and Physical  Selena White is a 30 y.o. G2P1001 with IUP at [redacted]w[redacted]d presenting with regular, painful contractions.   Patient states she has been having  regular, every five (5) to six (6) minutes contractions, minimal vaginal bleeding, intact membranes, with active fetal movement.    Denies difficulty breathing or respiratory distress, chest pain, and leg pain.   Prenatal Course  Source [facility] Care: [facility]   Pregnancy complications or risks: History arrhythmia, Anemia in pregnancy, History of COVID-19   Prenatal labs and studies:  ABO, Rh: A/Positive/-- 07/26/2023 1619)  Antibody: Negative 2023-07-26 1619)  Rubella: 4.68 2023-07-26 1619)  Varicella: 480 07-26-2023 1619)  RPR: Non Reactive (01/05 1131)   HBsAg: Negative 07/26/23 1619)   HIV: Non Reactive 2023/07/26 1619)   EZM:OQHUTMLY/-- (02/11 1049)  1 hr Glucola: 152 (01/05 1131)  Glucose Tolerance Test: 84, 111, 169, 124 (01/14 0811)  Genetic screening: Low risk female 2023-07-26 1619)  Anatomy US: Complete, normal (11/17 1543)  Past Medical History:  Diagnosis Date   Arrhythmia    Asthma    Hand tendonitis    Heavy periods    Mixed hypoglycemia    Overweight    Painful menstrual periods    UTI (lower urinary tract infection)     Past Surgical History:  Procedure Laterality Date   WISDOM TOOTH EXTRACTION      OB History  Gravida Para Term Preterm AB Living  2 1 1  0 0 1  SAB IAB Ectopic Multiple Live Births  0 0 0 0 1    # Outcome Date GA Lbr Len/2nd Weight Sex Delivery Anes PTL Lv  2 Current           1 Term             Social History   Socioeconomic History   Marital status: Married    Spouse name: Not on file   Number of children: Not on file   Years of education: Not on file   Highest education level: Not on file  Occupational History    Employer: [employer]  Tobacco Use   Smoking status: Never Smoker   Smokeless tobacco: Never Used  Vaping Use   Vaping Use: Never used   Substance and Sexual Activity   Alcohol use: No    Alcohol/week: 0.0 standard drinks   Drug use: No   Sexual activity: Yes    Partners: Male    Birth control/protection: Pill  Other Topics Concern   Not on file  Social History Narrative   Not on file   Social Determinants of Health   Financial Resource Strain: Not on file  Food Insecurity: Not on file  Transportation Needs: Not on file  Physical Activity: Not on file  Stress: Not on file  Social Connections: Not on file    Family History  Problem Relation Age of Onset   Heart disease Father    Diabetes Father    Hypertension Father    Heart failure Father    Cancer Maternal Grandmother        breast (mgm sister who has passed)   Diabetes Maternal Grandmother    Hypothyroidism Mother     Facility-Administered Medications Prior to Admission  Medication Dose Route Frequency Provider Last Rate Last Admin   Tdap (BOOSTRIX) injection 0.5 mL  0.5 mL Intramuscular Once Production assistant, radio, CNM       Medications Prior to Admission  Medication Sig Dispense Refill Last Dose  Ascorbic Acid (VITAMIN C) 100 MG tablet Take 100 mg by mouth daily.   Past Week at Unknown time   Doxylamine-Pyridoxine (DICLEGIS) 10-10 MG TBEC Take 2 tablets by mouth at bedtime. If symptoms persist, add one tablet in the morning and one in the afternoon 100 tablet 5 01/28/2021 at Unknown time   Iron-FA-B Cmp-C-Biot-Probiotic (FUSION PLUS) CAPS Take 1 tablet by mouth daily. 30 capsule 6 Past Week at Unknown time   magnesium 30 MG tablet Take 30 mg by mouth daily.   Past Week at Unknown time   Prenatal Vit-Fe Fumarate-FA (PRENATAL MULTIVITAMIN) TABS tablet Take 1 tablet by mouth daily at 12 noon.   01/28/2021 at Unknown time   zinc gluconate 50 MG tablet Take 50 mg by mouth daily.   Past Week at Unknown time    No Known Allergies  Review of Systems: Negative except for what is mentioned in HPI.  Physical Exam:  Temp:  [98.6 F (37 C)] 98.6 F (37 C)  (02/18 1804) Pulse Rate:  [101-109] 109 (02/18 1804) Resp:  [16] 16 (02/18 1804) BP: (113-122)/(74-84) 122/74 (02/18 1804) Weight:  [76.7 kg] 76.7 kg (02/18 1817)  GENERAL: Well-developed, well-nourished female in no acute distress.   LUNGS: Clear to auscultation bilaterally.   HEART: Regular rate and rhythm.  ABDOMEN: Soft, nontender, nondistended, gravid.  EXTREMITIES: Nontender, no edema, 2+ distal pulses.  Cervical Exam: Dilation: 7 Effacement (%): 80 Cervical Position: Middle Station: -1,-2 Presentation: Vertex Exam by:: Taylor Levick CNM   FHR Category I  Contractions: Every five (5) minutes, soft resting tone   Pertinent Labs/Studies:    Results for orders placed or performed in visit on 01/29/21 (from the past 24 hour(s))  POC Urinalysis Dipstick OB     Status: None   Collection Time: 01/29/21  4:39 PM  Result Value Ref Range   Color, UA auburn    Clarity, UA cloudy    Glucose, UA Negative Negative   Bilirubin, UA neg    Ketones, UA large    Spec Grav, UA 1.025 1.010 - 1.025   Blood, UA large    pH, UA 6.0 5.0 - 8.0   POC,PROTEIN,UA Small (1+) Negative, Trace, Small (1+), Moderate (2+), Large (3+), 4+   Urobilinogen, UA 1.0 0.2 or 1.0 E.U./dL   Nitrite, UA neg    Leukocytes, UA Negative Negative   Appearance auburn;cloudy    Odor      Assessment :  Selena White is a 30 y.o. G2P1001 at [redacted]w[redacted]d being admitted for labor, Rh positive, GBS negative, Anemia in pregnancy, History of COVID-19  FHR Category I  Plan:  Admit to birthing suites, see orders.   Labor: Expectant management.    Plan: Hopeful for vaginal birth.   Dr. Logan Bores notified of admission and plan of care.    Gunnar Bulla, CNM [facility], [facility]

## 2021-01-29 NOTE — Progress Notes (Signed)
OB-Pt present for ob check. Pt stated having some bloody show and contractions.

## 2021-01-30 DIAGNOSIS — Z3A37 37 weeks gestation of pregnancy: Secondary | ICD-10-CM

## 2021-01-30 LAB — CBC
HCT: 33.4 % — ABNORMAL LOW (ref 36.0–46.0)
Hemoglobin: 10.8 g/dL — ABNORMAL LOW (ref 12.0–15.0)
MCH: 29.3 pg (ref 26.0–34.0)
MCHC: 32.3 g/dL (ref 30.0–36.0)
MCV: 90.5 fL (ref 80.0–100.0)
Platelets: 127 10*3/uL — ABNORMAL LOW (ref 150–400)
RBC: 3.69 MIL/uL — ABNORMAL LOW (ref 3.87–5.11)
RDW: 17.8 % — ABNORMAL HIGH (ref 11.5–15.5)
WBC: 9.7 10*3/uL (ref 4.0–10.5)
nRBC: 0 % (ref 0.0–0.2)

## 2021-01-30 LAB — RPR: RPR Ser Ql: NONREACTIVE

## 2021-01-30 MED ORDER — WITCH HAZEL-GLYCERIN EX PADS: 1.0000 "application " | MEDICATED_PAD | CUTANEOUS | 12 refills | Status: DC | PRN

## 2021-01-30 MED ORDER — SIMETHICONE 80 MG PO CHEW: 80.0000 mg | CHEWABLE_TABLET | ORAL | 0 refills | Status: DC | PRN

## 2021-01-30 MED ORDER — ACETAMINOPHEN 500 MG PO TABS: 1000.0000 mg | ORAL_TABLET | Freq: Four times a day (QID) | ORAL | 0 refills | Status: DC | PRN

## 2021-01-30 MED ORDER — IBUPROFEN 600 MG PO TABS
600.0000 mg | ORAL_TABLET | Freq: Four times a day (QID) | ORAL | 0 refills | Status: DC
Start: 2021-01-30 — End: 2021-03-18

## 2021-01-30 MED ORDER — IBUPROFEN 600 MG PO TABS
600.0000 mg | ORAL_TABLET | Freq: Four times a day (QID) | ORAL | Status: DC
  Administered 2021-01-30 (×2): 600 mg via ORAL
  Filled 2021-01-30 (×2): qty 1

## 2021-01-30 NOTE — Progress Notes (Signed)
Patient ID: Selena White, female   DOB: [birth year], 30 y.o.   MRN: [id number]  Post Partum Day # 1, s/p spontaneous vaginal birth, Rh positive, GBS negative, Breastfeeding  Subjective:  Patient sitting in bed, husband at bedside holding infant.   Reports occasional gas pain, but doing well overall.   Denies difficulty breathing or respiratory distress, chest pain, abdominal pain, excessive vaginal bleeding, dysuria, and leg pain or swelling.   Objective:  Temp:  [98.1 F (36.7 C)-98.7 F (37.1 C)] 98.4 F (36.9 C) (02/19 0745) Pulse Rate:  [83-109] 83 (02/19 0745) Resp:  [8-18] 8 (02/19 0745) BP: (103-135)/(61-90) 103/61 (02/19 0745) SpO2:  [98 %-100 %] 100 % (02/19 0240) Weight:  [76.7 kg] 76.7 kg (02/18 1817)  Physical Exam:   General: alert and cooperative   Lungs: clear to auscultation bilaterally  Breasts: deferred, no complaints  Heart: normal apical impulse  Abdomen: soft, non-tender; bowel sounds normal; no masses,  no organomegaly  Pelvis: Lochia: appropriate, Uterine Fundus: firm  Extremities: DVT Evaluation: Negative Homan's sign.  Recent Labs    01/29/21 1904 01/30/21 0612  HGB 12.5 10.8*  HCT 38.1 33.4*    Assessment:  30 year old female G2P2, Post Partum Day # 1, s/p spontaneous vaginal birth, Rh positive, GBS negative  Breastfeeding  Plan:  Routine postpartum care and orders.   Reviewed red flag symptoms and when to call.   Continue orders as written. Reassess as needed.   Discharge later today or tomorrow depending on Peds.    LOS: 1 day    Serafina Royals, CNM [facility], [facility] 01/30/2021 10:38 AM

## 2021-01-30 NOTE — Progress Notes (Signed)
Pt discharged with infant.  Discharge instructions, prescriptions and follow up appointment given to and reviewed with pt. Pt verbalized understanding. Escorted out by staff. 

## 2021-01-30 NOTE — Discharge Instructions (Signed)
Postpartum Care After Vaginal Delivery The following information offers guidance about how to care for yourself from the time you deliver your baby to 6-12 weeks after delivery (postpartum period). If you have problems or questions, contact your health care provider for more specific instructions. Follow these instructions at home: Vaginal bleeding  It is normal to have vaginal bleeding (lochia) after delivery. Wear a sanitary pad for bleeding and discharge. ? During the first week after delivery, the amount and appearance of lochia is often similar to a menstrual period. ? Over the next few weeks, it will gradually decrease to a dry, yellow-brown discharge. ? For most women, lochia stops completely by 4-6 weeks after delivery, but can vary.  Change your sanitary pads frequently. Watch for any changes in your flow, such as: ? A sudden increase in volume. ? A change in color. ? Large blood clots.  If you pass a blood clot from your vagina, save it and call your health care provider. Do not flush blood clots down the toilet before talking with your health care provider.  Do not use tampons or douches until your health care provider approves.  If you are not breastfeeding, your period should return 6-8 weeks after delivery. If you are feeding your baby breast milk only, your period may not return until you stop breastfeeding. Perineal care  Keep the area between the vagina and the anus (perineum) clean and dry. Use medicated pads and pain-relieving sprays and creams as directed.  If you had a surgical cut in the perineum (episiotomy) or a tear, check the area for signs of infection until you are healed. Check for: ? More redness, swelling, or pain. ? Fluid or blood coming from the cut or tear. ? Warmth. ? Pus or a bad smell.  You may be given a squirt bottle to use instead of wiping to clean the perineum area after you use the bathroom. Pat the area gently to dry it.  To relieve pain  caused by an episiotomy, a tear, or swollen veins in the anus (hemorrhoids), take a warm sitz bath 2-3 times a day. In a sitz bath, the warm water should only come up to your hips and cover your buttocks.   Breast care  In the first few days after delivery, your breasts may feel heavy, full, and uncomfortable (breast engorgement). Milk may also leak from your breasts. Ask your health care provider about ways to help relieve the discomfort.  If you are breastfeeding: ? Wear a bra that supports your breasts and fits well. Use breast pads to absorb milk that leaks. ? Keep your nipples clean and dry. Apply creams and ointments as told. ? You may have uterine contractions every time you breastfeed for up to several weeks after delivery. This helps your uterus return to its normal size. ? If you have any problems with breastfeeding, notify your health care provider or lactation consultant.  If you are not breastfeeding: ? Avoid touching your breasts. Do not squeeze out (express) milk. Doing this can make your breasts produce more milk. ? Wear a good-fitting bra and use cold packs to help with swelling. Intimacy and sexuality  Ask your health care provider when you can engage in sexual activity. This may depend upon: ? Your risk of infection. ? How fast you are healing. ? Your comfort and desire to engage in sexual activity.  You are able to get pregnant after delivery, even if you have not had your period. Talk with   your health care provider about methods of birth control (contraception) or family planning if you desire future pregnancies. Medicines  Take over-the-counter and prescription medicines only as told by your health care provider.  Take an over-the-counter stool softener to help ease bowel movements as told by your health care provider.  If you were prescribed an antibiotic medicine, take it as told by your health care provider. Do not stop taking the antibiotic even if you start to  feel better.  Review all previous and current prescriptions to check for possible transfer into breast milk. Activity  Gradually return to your normal activities as told by your health care provider.  Rest as much as possible. Nap while your baby is sleeping. Eating and drinking  Drink enough fluid to keep your urine pale yellow.  To help prevent or relieve constipation, eat high-fiber foods every day.  Choose healthy eating to support breastfeeding or weight loss goals.  Take your prenatal vitamins until your health care provider tells you to stop.   General tips/recommendations  Do not use any products that contain nicotine or tobacco. These products include cigarettes, chewing tobacco, and vaping devices, such as e-cigarettes. If you need help quitting, ask your health care provider.  Do not drink alcohol, especially if you are breastfeeding.  Do not take medications or drugs that are not prescribed to you, especially if you are breastfeeding.  Visit your health care provider for a postpartum checkup within the first 3-6 weeks after delivery.  Complete a comprehensive postpartum visit no later than 12 weeks after delivery.  Keep all follow-up visits for you and your baby. Contact a health care provider if:  You feel unusually sad or worried.  Your breasts become red, painful, or hard.  You have a fever or other signs of an infection.  You have bleeding that is soaking through one pad an hour or you have blood clots.  You have a severe headache that doesn't go away or you have vision changes.  You have nausea and vomiting and are unable to eat or drink anything for 24 hours. Get help right away if:  You have chest pain or difficulty breathing.  You have sudden, severe leg pain.  You faint or have a seizure.  You have thoughts about hurting yourself or your baby. If you ever feel like you may hurt yourself or others, or have thoughts about taking your own life,  get help right away. Go to your nearest emergency department or:  Call your local emergency services (911 in the [place]  The National Suicide Prevention Lifeline at 1-800-273-8255. This suicide crisis helpline is open 24 hours a day.  Text the Crisis Text Line at 741741 (in the [place] Summary  The period of time after you deliver your newborn up to 6-12 weeks after delivery is called the postpartum period.  Keep all follow-up visits for you and your baby.  Review all previous and current prescriptions to check for possible transfer into breast milk.  Contact a health care provider if you feel unusually sad or worried during the postpartum period. This information is not intended to replace advice given to you by your health care provider. Make sure you discuss any questions you have with your health care provider. Document Revised: 08/13/2020 Document Reviewed: 08/13/2020 Else[employer] Patient Education  2021 [employer]   Postpartum Baby Blues The postpartum period begins right after the birth of a baby. During this time, there is often joy and excitement. It is   also a time of many changes in the life of the parents. A mother may feel happy one minute and sad or stressed the next. These feelings of sadness, called the baby blues, usually happen in the period right after the baby is born and go away within a week or two. What are the causes? The exact cause of this condition is not known. Changes in hormone levels after childbirth are believed to trigger some of the symptoms. Other factors that can play a role in these mood changes include:  Lack of sleep.  Stressful life events, such as financial problems, caring for a loved one, or death of a loved one.  Genetics. What are the signs or symptoms? Symptoms of this condition include:  Changes in mood, such as going from extreme happiness to sadness.  A decrease in concentration.  Difficulty sleeping.  Crying spells and  tearfulness.  Loss of appetite.  Irritability.  Anxiety. If these symptoms last for more than 2 weeks or become more severe, you may have postpartum depression. How is this diagnosed? This condition is diagnosed based on an evaluation of your symptoms. Your health care provider may use a screening tool that includes a list of questions to help identify a person with the baby blues or postpartum depression. How is this treated? The baby blues usually go away on their own in 1-2 weeks. Social support is often what is needed. You will be encouraged to get adequate sleep and rest. Follow these instructions at home: Lifestyle  Get as much rest as you can. Take a nap when the baby sleeps.  Exercise regularly as told by your health care provider. Some women find yoga and walking to be helpful.  Eat a balanced and nourishing diet. This includes plenty of fruits and vegetables, whole grains, and lean proteins.  Do little things that you enjoy. Take a bubble bath, read your favorite magazine, or listen to your favorite music.  Avoid alcohol.  Ask for help with household chores, cooking, grocery shopping, or running errands. Do not try to do everything yourself. Consider hiring a postpartum doula to help. This is a professional who specializes in providing support to new mothers.  Try not to make any major life changes during pregnancy or right after giving birth. This can add stress.      General instructions  Talk to people close to you about how you are feeling. Get support from your partner, family members, friends, or other new moms. You may want to join a support group.  Find ways to manage stress. This may include: ? Writing your thoughts and feelings in a journal. ? Spending time outside. ? Spending time with people who make you laugh.  Try to stay positive in how you think. Think about the things you are grateful for.  Take over-the-counter and prescription medicines only as  told by your health care provider.  Let your health care provider know if you have any concerns.  Keep all postpartum visits. This is important. Contact a health care provider if:  Your baby blues do not go away after 2 weeks. Get help right away if:  You have thoughts of taking your own life (suicidal thoughts), or of harming your baby or someone else.  You see or hear things that are not there (hallucinations). If you ever feel like you may hurt yourself or others, or have thoughts about taking your own life, get help right away. Go to your nearest emergency department or:    Call your local emergency services (911 in the [place]  Call a suicide crisis helpline, such as the National Suicide Prevention Lifeline, at 1-800-273-8255. This is open 24 hours a day in the [place]  Text the Crisis Text Line at 741741 (in the [place] Summary  After giving birth, you may feel happy one minute and sad or stressed the next. Feelings of sadness that happen right after the baby is born and go away after a week or two are called the baby blues.  You can manage the baby blues by getting enough rest, eating a healthy diet, exercising, spending time with supportive people, and finding ways to manage stress.  If feelings of sadness and stress last longer than 2 weeks or get in the way of caring for your baby, talk with your health care provider. This may mean you have postpartum depression. This information is not intended to replace advice given to you by your health care provider. Make sure you discuss any questions you have with your health care provider. Document Revised: 05/22/2020 Document Reviewed: 05/22/2020 Elsevier Patient Education  2021 [employer]   Breastfeeding  Choosing to breastfeed is one of the best decisions you can make for yourself and your baby. A change in hormones during pregnancy causes your breasts to make breast milk in your milk-producing glands. Hormones prevent breast milk  from being released before your baby is born. They also prompt milk flow after birth. Once breastfeeding has begun, thoughts of your baby, as well as his or her sucking or crying, can stimulate the release of milk from your milk-producing glands. Benefits of breastfeeding Research shows that breastfeeding offers many health benefits for infants and mothers. It also offers a cost-free and convenient way to feed your baby. For your baby  Your first milk (colostrum) helps your baby's digestive system to function better.  Special cells in your milk (antibodies) help your baby to fight off infections.  Breastfed babies are less likely to develop asthma, allergies, obesity, or type 2 diabetes. They are also at lower risk for sudden infant death syndrome (SIDS).  Nutrients in breast milk are better able to meet your baby's needs compared to infant formula.  Breast milk improves your baby's brain development. For you  Breastfeeding helps to create a very special bond between you and your baby.  Breastfeeding is convenient. Breast milk costs nothing and is always available at the correct temperature.  Breastfeeding helps to burn calories. It helps you to lose the weight that you gained during pregnancy.  Breastfeeding makes your uterus return faster to its size before pregnancy. It also slows bleeding (lochia) after you give birth.  Breastfeeding helps to lower your risk of developing type 2 diabetes, osteoporosis, rheumatoid arthritis, cardiovascular disease, and breast, ovarian, uterine, and endometrial cancer later in life. Breastfeeding basics Starting breastfeeding  Find a comfortable place to sit or lie down, with your neck and back well-supported.  Place a pillow or a rolled-up blanket under your baby to bring him or her to the level of your breast (if you are seated). Nursing pillows are specially designed to help support your arms and your baby while you breastfeed.  Make sure that  your baby's tummy (abdomen) is facing your abdomen.  Gently massage your breast. With your fingertips, massage from the outer edges of your breast inward toward the nipple. This encourages milk flow. If your milk flows slowly, you may need to continue this action during the feeding.  Support your breast   with 4 fingers underneath and your thumb above your nipple (make the letter "C" with your hand). Make sure your fingers are well away from your nipple and your baby's mouth.  Stroke your baby's lips gently with your finger or nipple.  When your baby's mouth is open wide enough, quickly bring your baby to your breast, placing your entire nipple and as much of the areola as possible into your baby's mouth. The areola is the colored area around your nipple. ? More areola should be visible above your baby's upper lip than below the lower lip. ? Your baby's lips should be opened and extended outward (flanged) to ensure an adequate, comfortable latch. ? Your baby's tongue should be between his or her lower gum and your breast.  Make sure that your baby's mouth is correctly positioned around your nipple (latched). Your baby's lips should create a seal on your breast and be turned out (everted).  It is common for your baby to suck about 2-3 minutes in order to start the flow of breast milk. Latching Teaching your baby how to latch onto your breast properly is very important. An improper latch can cause nipple pain, decreased milk supply, and poor weight gain in your baby. Also, if your baby is not latched onto your nipple properly, he or she may swallow some air during feeding. This can make your baby fussy. Burping your baby when you switch breasts during the feeding can help to get rid of the air. However, teaching your baby to latch on properly is still the best way to prevent fussiness from swallowing air while breastfeeding. Signs that your baby has successfully latched onto your nipple  Silent  tugging or silent sucking, without causing you pain. Infant's lips should be extended outward (flanged).  Swallowing heard between every 3-4 sucks once your milk has started to flow (after your let-down milk reflex occurs).  Muscle movement above and in front of his or her ears while sucking. Signs that your baby has not successfully latched onto your nipple  Sucking sounds or smacking sounds from your baby while breastfeeding.  Nipple pain. If you think your baby has not latched on correctly, slip your finger into the corner of your baby's mouth to break the suction and place it between your baby's gums. Attempt to start breastfeeding again. Signs of successful breastfeeding Signs from your baby  Your baby will gradually decrease the number of sucks or will completely stop sucking.  Your baby will fall asleep.  Your baby's body will relax.  Your baby will retain a small amount of milk in his or her mouth.  Your baby will let go of your breast by himself or herself. Signs from you  Breasts that have increased in firmness, weight, and size 1-3 hours after feeding.  Breasts that are softer immediately after breastfeeding.  Increased milk volume, as well as a change in milk consistency and color by the fifth day of breastfeeding.  Nipples that are not sore, cracked, or bleeding. Signs that your baby is getting enough milk  Wetting at least 1-2 diapers during the first 24 hours after birth.  Wetting at least 5-6 diapers every 24 hours for the first week after birth. The urine should be clear or pale yellow by the age of 5 days.  Wetting 6-8 diapers every 24 hours as your baby continues to grow and develop.  At least 3 stools in a 24-hour period by the age of 5 days. The stool should   be soft and yellow.  At least 3 stools in a 24-hour period by the age of 7 days. The stool should be seedy and yellow.  No loss of weight greater than 10% of birth weight during the first 3 days of  life.  Average weight gain of 4-7 oz (113-198 g) per week after the age of 4 days.  Consistent daily weight gain by the age of 5 days, without weight loss after the age of 2 weeks. After a feeding, your baby may spit up a small amount of milk. This is normal. Breastfeeding frequency and duration Frequent feeding will help you make more milk and can prevent sore nipples and extremely full breasts (breast engorgement). Breastfeed when you feel the need to reduce the fullness of your breasts or when your baby shows signs of hunger. This is called "breastfeeding on demand." Signs that your baby is hungry include:  Increased alertness, activity, or restlessness.  Movement of the head from side to side.  Opening of the mouth when the corner of the mouth or cheek is stroked (rooting).  Increased sucking sounds, smacking lips, cooing, sighing, or squeaking.  Hand-to-mouth movements and sucking on fingers or hands.  Fussing or crying. Avoid introducing a pacifier to your baby in the first 4-6 weeks after your baby is born. After this time, you may choose to use a pacifier. Research has shown that pacifier use during the first year of a baby's life decreases the risk of sudden infant death syndrome (SIDS). Allow your baby to feed on each breast as long as he or she wants. When your baby unlatches or falls asleep while feeding from the first breast, offer the second breast. Because newborns are often sleepy in the first few weeks of life, you may need to awaken your baby to get him or her to feed. Breastfeeding times will vary from baby to baby. However, the following rules can serve as a guide to help you make sure that your baby is properly fed:  Newborns (babies 4 weeks of age or younger) may breastfeed every 1-3 hours.  Newborns should not go without breastfeeding for longer than 3 hours during the day or 5 hours during the night.  You should breastfeed your baby a minimum of 8 times in a  24-hour period. Breast milk pumping Pumping and storing breast milk allows you to make sure that your baby is exclusively fed your breast milk, even at times when you are unable to breastfeed. This is especially important if you go back to work while you are still breastfeeding, or if you are not able to be present during feedings. Your lactation consultant can help you find a method of pumping that works best for you and give you guidelines about how long it is safe to store breast milk.      Caring for your breasts while you breastfeed Nipples can become dry, cracked, and sore while breastfeeding. The following recommendations can help keep your breasts moisturized and healthy:  Avoid using soap on your nipples.  Wear a supportive bra designed especially for nursing. Avoid wearing underwire-style bras or extremely tight bras (sports bras).  Air-dry your nipples for 3-4 minutes after each feeding.  Use only cotton bra pads to absorb leaked breast milk. Leaking of breast milk between feedings is normal.  Use lanolin on your nipples after breastfeeding. Lanolin helps to maintain your skin's normal moisture barrier. Pure lanolin is not harmful (not toxic) to your baby. You may also   hand express a few drops of breast milk and gently massage that milk into your nipples and allow the milk to air-dry. In the first few weeks after giving birth, some women experience breast engorgement. Engorgement can make your breasts feel heavy, warm, and tender to the touch. Engorgement peaks within 3-5 days after you give birth. The following recommendations can help to ease engorgement:  Completely empty your breasts while breastfeeding or pumping. You may want to start by applying warm, moist heat (in the shower or with warm, water-soaked hand towels) just before feeding or pumping. This increases circulation and helps the milk flow. If your baby does not completely empty your breasts while breastfeeding, pump any  extra milk after he or she is finished.  Apply ice packs to your breasts immediately after breastfeeding or pumping, unless this is too uncomfortable for you. To do this: ? Put ice in a plastic bag. ? Place a towel between your skin and the bag. ? Leave the ice on for 20 minutes, 2-3 times a day.  Make sure that your baby is latched on and positioned properly while breastfeeding. If engorgement persists after 48 hours of following these recommendations, contact your health care provider or a lactation consultant. Overall health care recommendations while breastfeeding  Eat 3 healthy meals and 3 snacks every day. Well-nourished mothers who are breastfeeding need an additional 450-500 calories a day. You can meet this requirement by increasing the amount of a balanced diet that you eat.  Drink enough water to keep your urine pale yellow or clear.  Rest often, relax, and continue to take your prenatal vitamins to prevent fatigue, stress, and low vitamin and mineral levels in your body (nutrient deficiencies).  Do not use any products that contain nicotine or tobacco, such as cigarettes and e-cigarettes. Your baby may be harmed by chemicals from cigarettes that pass into breast milk and exposure to secondhand smoke. If you need help quitting, ask your health care provider.  Avoid alcohol.  Do not use illegal drugs or marijuana.  Talk with your health care provider before taking any medicines. These include over-the-counter and prescription medicines as well as vitamins and herbal supplements. Some medicines that may be harmful to your baby can pass through breast milk.  It is possible to become pregnant while breastfeeding. If birth control is desired, ask your health care provider about options that will be safe while breastfeeding your baby. Where to find more information: [employer]: www.llli.org Contact a health care provider if:  You feel like you want to stop  breastfeeding or have become frustrated with breastfeeding.  Your nipples are cracked or bleeding.  Your breasts are red, tender, or warm.  You have: ? Painful breasts or nipples. ? A swollen area on either breast. ? A fever or chills. ? Nausea or vomiting. ? Drainage other than breast milk from your nipples.  Your breasts do not become full before feedings by the fifth day after you give birth.  You feel sad and depressed.  Your baby is: ? Too sleepy to eat well. ? Having trouble sleeping. ? More than 1 week old and wetting fewer than 6 diapers in a 24-hour period. ? Not gaining weight by 5 days of age.  Your baby has fewer than 3 stools in a 24-hour period.  Your baby's skin or the white parts of his or her eyes become yellow. Get help right away if:  Your baby is overly tired (lethargic) and does   not want to wake up and feed.  Your baby develops an unexplained fever. Summary  Breastfeeding offers many health benefits for infant and mothers.  Try to breastfeed your infant when he or she shows early signs of hunger.  Gently tickle or stroke your baby's lips with your finger or nipple to allow the baby to open his or her mouth. Bring the baby to your breast. Make sure that much of the areola is in your baby's mouth. Offer one side and burp the baby before you offer the other side.  Talk with your health care provider or lactation consultant if you have questions or you face problems as you breastfeed. This information is not intended to replace advice given to you by your health care provider. Make sure you discuss any questions you have with your health care provider. Document Revised: 02/22/2018 Document Reviewed: 12/30/2016 Elsevier Patient Education  2021 Elsevier Inc.  

## 2021-01-30 NOTE — Progress Notes (Signed)
Labor check-Reports irregular contractions since noon and bloody show with wiping. Requests SVE. Limited assessment. Advised patient she may report to [employer] anytime. Report call the [place] Labor and Delivery chart RN.

## 2021-01-30 NOTE — Discharge Summary (Signed)
Obstetric Discharge Summary  Patient ID: Selena White MRN: [id number] DOB/AGE: [birth year] 30 y.o.   Date of Admission: 01/29/2021  Date of Discharge:   01/30/21  Admitting Diagnosis: Onset of Labor at [redacted]w[redacted]d  Secondary Diagnosis:   Patient Active Problem List   Diagnosis Date Noted   Normal labor 01/29/2021   COVID-19 affecting pregnancy in third trimester 12/07/2020   Supervision of other normal pregnancy, antepartum 12/07/2020    Mode of Delivery: normal spontaneous vaginal delivery     Discharge Diagnosis: No other diagnosis   Intrapartum Procedures:  None   Post partum procedures:  None  Complications:  Second degree perineal laceration, repaired   Brief Hospital Course   Selena White is a [id number] who had a SVD on 01/29/2021;  for further details of this birth, please refer to the delivey summary.  Patient had an uncomplicated postpartum course.  By time of discharge on PPD#1, her pain was controlled on oral pain medications; she had appropriate lochia and was ambulating, voiding without difficulty and tolerating regular diet.  She was deemed stable for discharge to home.    Labs:  CBC Latest Ref Rng & Units 01/30/2021 01/29/2021 12/16/2020  WBC 4.0 - 10.5 K/uL 9.7 9.2 8.4  Hemoglobin 12.0 - 15.0 g/dL 10.8(L) 12.5 10.6(L)  Hematocrit 36.0 - 46.0 % 33.4(L) 38.1 31.8(L)  Platelets 150 - 400 K/uL 127(L) 140(L) 162   A POS  Physical exam:   Temp:  [98.1 F (36.7 C)-98.7 F (37.1 C)] 98.4 F (36.9 C) (02/19 0745) Pulse Rate:  [83-109] 83 (02/19 0745) Resp:  [8-18] 8 (02/19 0745) BP: (103-135)/(61-90) 103/61 (02/19 0745) SpO2:  [98 %-100 %] 100 % (02/19 0240) Weight:  [76.7 kg] 76.7 kg (02/18 1817)  General: alert and no distress  Lochia: appropriate  Abdomen: soft, NT  Uterine Fundus: firm  Perineum: healing well, no significant drainage, no dehiscence, no significant erythema  Extremities: no evidence of DVT seen on physical exam. No lower extremity  edema  Edinburgh Postnatal Depression Scale Screening Tool 01/30/2021  I have been able to laugh and see the funny side of things. 0  I have looked forward with enjoyment to things. 0  I have blamed myself unnecessarily when things went wrong. 1  I have been anxious or worried for no good reason. 0  I have felt scared or panicky for no good reason. 0  Things have been getting on top of me. 0  I have been so unhappy that I have had difficulty sleeping. 0  I have felt sad or miserable. 0  I have been so unhappy that I have been crying. 0  The thought of harming myself has occurred to me. 0  Edinburgh Postnatal Depression Scale Total 1     Discharge Instructions: Per After Visit Summary  Activity: Advance as tolerated. Pelvic rest for 6 weeks.  Also refer to After Visit Summary  Diet: Regular  Medications: Allergies as of 01/30/2021   No Known Allergies      Medication List     STOP taking these medications    Doxylamine-Pyridoxine 10-10 MG Tbec Commonly known as: Diclegis       TAKE these medications    acetaminophen 500 MG tablet Commonly known as: TYLENOL Take 2 tablets (1,000 mg total) by mouth every 6 (six) hours as needed for mild pain or moderate pain (for pain scale < 4).   Fusion Plus Caps Take 1 tablet by mouth daily.   ibuprofen 600 MG tablet Commonly  known as: ADVIL Take 1 tablet (600 mg total) by mouth every 6 (six) hours.   magnesium 30 MG tablet Take 30 mg by mouth daily.   prenatal multivitamin Tabs tablet Take 1 tablet by mouth daily at 12 noon.   simethicone 80 MG chewable tablet Commonly known as: MYLICON Chew 1 tablet (80 mg total) by mouth as needed for flatulence.   vitamin C 100 MG tablet Take 100 mg by mouth daily.   witch hazel-glycerin pad Commonly known as: TUCKS Apply 1 application topically as needed for hemorrhoids.   zinc gluconate 50 MG tablet Take 50 mg by mouth daily.       Outpatient follow up:   Follow-up  Information     Gunnar Bulla, CNM Follow up.   Specialties: Certified Nurse Midwife, Obstetrics and Gynecology, Radiology Why: Someone from the office will call you to schedule:  Two (2) week TELEVISIT Six (6) week POSTPARTUM VISIT Contact information: [address] [place] [address] [phone]                Postpartum contraception: natural family planning (NFP)  Discharged Condition: stable  Discharged to: home   Newborn Data:  Disposition:home with mother  Apgars: APGAR (1 MIN): 7   APGAR (5 MINS): 9    Baby Feeding: Breast   Serafina Royals, CNM  [facility], [facility] 01/30/21 11:09 AM

## 2021-01-30 NOTE — Lactation Note (Signed)
This note was copied from a baby's chart. Lactation Consultation Note  Patient Name: Selena White HYHOO'I Date: 01/30/2021 Reason for consult: Initial assessment;Early term 37-38.6wks Age:30 hours  Lactation to the room for initial visit. Baby is getting her first bath. Mother stated feeds are going well, baby latches better than her first did.  Encouraged feeding on demand and with cues. If baby is not cueing encouraged hand expression and skin to skin. Reviewed hand expression with Mother. Encouraged 8 or more attempts in the first 24 hours and 8 or more good feeds after 24 HOL. Reviewed appropriate diapers for days of life and How to know your baby is getting enough to eat. Reviewed "Understanding Postpartum and Newborn Care" booklet at bedside. LC # on board, encouraged to call for any assistance. Mother has a Medela pump at home form first child and has ordered a new one.  Mother has no further questions at this time.   Maternal Data Has patient been taught Hand Expression?: Yes (Reviewed in booklet with Mother, stated she is aware) Does the patient have breastfeeding experience prior to this delivery?: Yes How long did the patient breastfeed?: 1 year  Feeding Mother's Current Feeding Choice: Breast Milk   Interventions Interventions: Breast feeding basics reviewed;Education  Discharge    Consult Status Consult Status: PRN    Selena White 01/30/2021, 4:18 PM

## 2021-02-05 ENCOUNTER — Encounter: Payer: 59 | Admitting: Certified Nurse Midwife

## 2021-02-10 ENCOUNTER — Encounter: Payer: 59 | Admitting: Certified Nurse Midwife

## 2021-02-12 ENCOUNTER — Ambulatory Visit (INDEPENDENT_AMBULATORY_CARE_PROVIDER_SITE_OTHER): Payer: 59 | Admitting: Certified Nurse Midwife

## 2021-02-12 ENCOUNTER — Other Ambulatory Visit: Payer: Self-pay

## 2021-02-12 ENCOUNTER — Encounter: Payer: Self-pay | Admitting: Certified Nurse Midwife

## 2021-02-12 DIAGNOSIS — Z1331 Encounter for screening for depression: Secondary | ICD-10-CM

## 2021-02-12 DIAGNOSIS — Z8616 Personal history of COVID-19: Secondary | ICD-10-CM | POA: Insufficient documentation

## 2021-02-12 NOTE — Telephone Encounter (Signed)
Please advise. Thanks Fikisha 

## 2021-02-12 NOTE — Progress Notes (Signed)
Virtual Visit via Telephone Note  I connected with Selena White on 02/12/21 at 11:30 AM EST by telephone and verified that I am speaking with the correct person using two identifiers.  Location:  Patient: Selena White (home)  Provider: Serafina Royals, CNM ([facility], [facility])   I discussed the limitations, risks, security and privacy concerns of performing an evaluation and management service by telephone and the availability of in person appointments. I also discussed with the patient that there may be a patient responsible charge related to this service. The patient expressed understanding and agreed to proceed.   History of Present Illness:  Telephone call to patient, verified full name and date of birth.   Two (2) week status postpartum after spontaneous vaginal birth.   Doing well overall. Bleeding varies for heavy [changing pad three (3)  to four (4) times a day] with "thumb size" clots and light (changing pad daily).   Sleeping three (3) to four (4) hours at a time. Has good support at home.    Observations/Objective:  Depression screen [facility] 2/9 02/12/2021 01/22/2021 12/15/2017  Decreased Interest 0 0 0  Down, Depressed, Hopeless 0 0 0  PHQ - 2 Score 0 0 0  Altered sleeping 0 - 0  Tired, decreased energy 1 - 0  Change in appetite 0 - 0  Feeling bad or failure about yourself  0 - 0  Trouble concentrating 0 - 0  Moving slowly or fidgety/restless 0 - 0  Suicidal thoughts 0 - 0  PHQ-9 Score 1 - 0  Difficult doing work/chores Not difficult at all - -   GAD 7 : Generalized Anxiety Score 02/12/2021  Nervous, Anxious, on Edge 0  Control/stop worrying 0  Worry too much - different things 0  Trouble relaxing 0  Restless 0  Easily annoyed or irritable 0  Afraid - awful might happen 0  Total GAD 7 Score 0    Assessment:  Postpartum care and examination Lactating mother Depression screening negative   Plan:  Routine postpartum care.   Reviewed red flag  symptoms and when to call.   RTC as previously scheduled or sooner if needed.   Follow Up Instructions: PPV 03/18/2021    I discussed the assessment and treatment plan with the patient. The patient was provided an opportunity to ask questions and all were answered. The patient agreed with the plan and demonstrated an understanding of the instructions.   The patient was advised to call back or seek an in-person evaluation if the symptoms worsen or if the condition fails to improve as anticipated.  I provided  minutes of non-face-to-face time during this encounter.   Serafina Royals, CNM [facility], [facility] 02/12/21 12:05 PM

## 2021-02-12 NOTE — Patient Instructions (Signed)
Postpartum Baby Blues The postpartum period begins right after the birth of a baby. During this time, there is often joy and excitement. It is also a time of many changes in the life of the parents. A mother may feel happy one minute and sad or stressed the next. These feelings of sadness, called the baby blues, usually happen in the period right after the baby is born and go away within a week or two. What are the causes? The exact cause of this condition is not known. Changes in hormone levels after childbirth are believed to trigger some of the symptoms. Other factors that can play a role in these mood changes include:  Lack of sleep.  Stressful life events, such as financial problems, caring for a loved one, or death of a loved one.  Genetics. What are the signs or symptoms? Symptoms of this condition include:  Changes in mood, such as going from extreme happiness to sadness.  A decrease in concentration.  Difficulty sleeping.  Crying spells and tearfulness.  Loss of appetite.  Irritability.  Anxiety. If these symptoms last for more than 2 weeks or become more severe, you may have postpartum depression. How is this diagnosed? This condition is diagnosed based on an evaluation of your symptoms. Your health care provider may use a screening tool that includes a list of questions to help identify a person with the baby blues or postpartum depression. How is this treated? The baby blues usually go away on their own in 1-2 weeks. Social support is often what is needed. You will be encouraged to get adequate sleep and rest. Follow these instructions at home: Lifestyle  Get as much rest as you can. Take a nap when the baby sleeps.  Exercise regularly as told by your health care provider. Some women find yoga and walking to be helpful.  Eat a balanced and nourishing diet. This includes plenty of fruits and vegetables, whole grains, and lean proteins.  Do little things that you  enjoy. Take a bubble bath, read your favorite magazine, or listen to your favorite music.  Avoid alcohol.  Ask for help with household chores, cooking, grocery shopping, or running errands. Do not try to do everything yourself. Consider hiring a postpartum doula to help. This is a professional who specializes in providing support to new mothers.  Try not to make any major life changes during pregnancy or right after giving birth. This can add stress.      General instructions  Talk to people close to you about how you are feeling. Get support from your partner, family members, friends, or other new moms. You may want to join a support group.  Find ways to manage stress. This may include: ? Writing your thoughts and feelings in a journal. ? Spending time outside. ? Spending time with people who make you laugh.  Try to stay positive in how you think. Think about the things you are grateful for.  Take over-the-counter and prescription medicines only as told by your health care provider.  Let your health care provider know if you have any concerns.  Keep all postpartum visits. This is important. Contact a health care provider if:  Your baby blues do not go away after 2 weeks. Get help right away if:  You have thoughts of taking your own life (suicidal thoughts), or of harming your baby or someone else.  You see or hear things that are not there (hallucinations). If you ever feel like you   may hurt yourself or others, or have thoughts about taking your own life, get help right away. Go to your nearest emergency department or:  Call your local emergency services (911 in the [place]  Call a suicide crisis helpline, such as the National Suicide Prevention Lifeline, at (573)435-7882. This is open 24 hours a day in the [place]  Text the Crisis Text Line at (209)414-4261 (in the [place] Summary  After giving birth, you may feel happy one minute and sad or stressed the next. Feelings of sadness  that happen right after the baby is born and go away after a week or two are called the baby blues.  You can manage the baby blues by getting enough rest, eating a healthy diet, exercising, spending time with supportive people, and finding ways to manage stress.  If feelings of sadness and stress last longer than 2 weeks or get in the way of caring for your baby, talk with your health care provider. This may mean you have postpartum depression. This information is not intended to replace advice given to you by your health care provider. Make sure you discuss any questions you have with your health care provider. Document Revised: 05/22/2020 Document Reviewed: 05/22/2020 Elsevier Patient Education  2021 Elsevier Inc.   Postpartum Care After Vaginal Delivery The following information offers guidance about how to care for yourself from the time you deliver your baby to 6-12 weeks after delivery (postpartum period). If you have problems or questions, contact your health care provider for more specific instructions. Follow these instructions at home: Vaginal bleeding  It is normal to have vaginal bleeding (lochia) after delivery. Wear a sanitary pad for bleeding and discharge. ? During the first week after delivery, the amount and appearance of lochia is often similar to a menstrual period. ? Over the next few weeks, it will gradually decrease to a dry, yellow-brown discharge. ? For most women, lochia stops completely by 4-6 weeks after delivery, but can vary.  Change your sanitary pads frequently. Watch for any changes in your flow, such as: ? A sudden increase in volume. ? A change in color. ? Large blood clots.  If you pass a blood clot from your vagina, save it and call your health care provider. Do not flush blood clots down the toilet before talking with your health care provider.  Do not use tampons or douches until your health care provider approves.  If you are not breastfeeding,  your period should return 6-8 weeks after delivery. If you are feeding your baby breast milk only, your period may not return until you stop breastfeeding. Perineal care  Keep the area between the vagina and the anus (perineum) clean and dry. Use medicated pads and pain-relieving sprays and creams as directed.  If you had a surgical cut in the perineum (episiotomy) or a tear, check the area for signs of infection until you are healed. Check for: ? More redness, swelling, or pain. ? Fluid or blood coming from the cut or tear. ? Warmth. ? Pus or a bad smell.  You may be given a squirt bottle to use instead of wiping to clean the perineum area after you use the bathroom. Pat the area gently to dry it.  To relieve pain caused by an episiotomy, a tear, or swollen veins in the anus (hemorrhoids), take a warm sitz bath 2-3 times a day. In a sitz bath, the warm water should only come up to your hips and cover your buttocks.  Breast care  In the first few days after delivery, your breasts may feel heavy, full, and uncomfortable (breast engorgement). Milk may also leak from your breasts. Ask your health care provider about ways to help relieve the discomfort.  If you are breastfeeding: ? Wear a bra that supports your breasts and fits well. Use breast pads to absorb milk that leaks. ? Keep your nipples clean and dry. Apply creams and ointments as told. ? You may have uterine contractions every time you breastfeed for up to several weeks after delivery. This helps your uterus return to its normal size. ? If you have any problems with breastfeeding, notify your health care provider or lactation consultant.  If you are not breastfeeding: ? Avoid touching your breasts. Do not squeeze out (express) milk. Doing this can make your breasts produce more milk. ? Wear a good-fitting bra and use cold packs to help with swelling. Intimacy and sexuality  Ask your health care provider when you can engage in  sexual activity. This may depend upon: ? Your risk of infection. ? How fast you are healing. ? Your comfort and desire to engage in sexual activity.  You are able to get pregnant after delivery, even if you have not had your period. Talk with your health care provider about methods of birth control (contraception) or family planning if you desire future pregnancies. Medicines  Take over-the-counter and prescription medicines only as told by your health care provider.  Take an over-the-counter stool softener to help ease bowel movements as told by your health care provider.  If you were prescribed an antibiotic medicine, take it as told by your health care provider. Do not stop taking the antibiotic even if you start to feel better.  Review all previous and current prescriptions to check for possible transfer into breast milk. Activity  Gradually return to your normal activities as told by your health care provider.  Rest as much as possible. Nap while your baby is sleeping. Eating and drinking  Drink enough fluid to keep your urine pale yellow.  To help prevent or relieve constipation, eat high-fiber foods every day.  Choose healthy eating to support breastfeeding or weight loss goals.  Take your prenatal vitamins until your health care provider tells you to stop.   General tips/recommendations  Do not use any products that contain nicotine or tobacco. These products include cigarettes, chewing tobacco, and vaping devices, such as e-cigarettes. If you need help quitting, ask your health care provider.  Do not drink alcohol, especially if you are breastfeeding.  Do not take medications or drugs that are not prescribed to you, especially if you are breastfeeding.  Visit your health care provider for a postpartum checkup within the first 3-6 weeks after delivery.  Complete a comprehensive postpartum visit no later than 12 weeks after delivery.  Keep all follow-up visits for you  and your baby. Contact a health care provider if:  You feel unusually sad or worried.  Your breasts become red, painful, or hard.  You have a fever or other signs of an infection.  You have bleeding that is soaking through one pad an hour or you have blood clots.  You have a severe headache that doesn't go away or you have vision changes.  You have nausea and vomiting and are unable to eat or drink anything for 24 hours. Get help right away if:  You have chest pain or difficulty breathing.  You have sudden, severe leg pain.  You  faint or have a seizure.  You have thoughts about hurting yourself or your baby. If you ever feel like you may hurt yourself or others, or have thoughts about taking your own life, get help right away. Go to your nearest emergency department or:  Call your local emergency services (911 in the [place]  The National Suicide Prevention Lifeline at 609-170-3302. This suicide crisis helpline is open 24 hours a day.  Text the Crisis Text Line at (307)359-7444 (in the [place] Summary  The period of time after you deliver your newborn up to 6-12 weeks after delivery is called the postpartum period.  Keep all follow-up visits for you and your baby.  Review all previous and current prescriptions to check for possible transfer into breast milk.  Contact a health care provider if you feel unusually sad or worried during the postpartum period. This information is not intended to replace advice given to you by your health care provider. Make sure you discuss any questions you have with your health care provider. Document Revised: 08/13/2020 Document Reviewed: 08/13/2020 Elsevier Patient Education  2021 [employer].

## 2021-02-12 NOTE — Progress Notes (Signed)
Televisit-Pt present for televisit due to 2 week PPV mood check. Pt stated that she was doing well. Pt was unable to document bp or wt due to televisit.

## 2021-03-17 ENCOUNTER — Ambulatory Visit: Payer: Managed Care, Other (non HMO) | Admitting: Family

## 2021-03-18 ENCOUNTER — Encounter: Payer: 59 | Admitting: Certified Nurse Midwife

## 2021-03-18 ENCOUNTER — Other Ambulatory Visit: Payer: Self-pay

## 2021-03-18 ENCOUNTER — Encounter: Payer: Self-pay | Admitting: Cardiology

## 2021-03-18 ENCOUNTER — Encounter: Payer: Self-pay | Admitting: Certified Nurse Midwife

## 2021-03-18 ENCOUNTER — Ambulatory Visit (INDEPENDENT_AMBULATORY_CARE_PROVIDER_SITE_OTHER): Payer: Managed Care, Other (non HMO) | Admitting: Cardiology

## 2021-03-18 ENCOUNTER — Ambulatory Visit (INDEPENDENT_AMBULATORY_CARE_PROVIDER_SITE_OTHER): Payer: 59 | Admitting: Certified Nurse Midwife

## 2021-03-18 VITALS — BP 110/60 | HR 77 | Ht 61.0 in | Wt 150.0 lb

## 2021-03-18 DIAGNOSIS — Z8249 Family history of ischemic heart disease and other diseases of the circulatory system: Secondary | ICD-10-CM | POA: Diagnosis not present

## 2021-03-18 DIAGNOSIS — I471 Supraventricular tachycardia: Secondary | ICD-10-CM | POA: Diagnosis not present

## 2021-03-18 DIAGNOSIS — Z1331 Encounter for screening for depression: Secondary | ICD-10-CM | POA: Diagnosis not present

## 2021-03-18 DIAGNOSIS — Z3009 Encounter for other general counseling and advice on contraception: Secondary | ICD-10-CM

## 2021-03-18 DIAGNOSIS — Z30011 Encounter for initial prescription of contraceptive pills: Secondary | ICD-10-CM

## 2021-03-18 LAB — POCT URINE PREGNANCY: Preg Test, Ur: NEGATIVE

## 2021-03-18 MED ORDER — NORETHINDRONE 0.35 MG PO TABS: 1.0000 | ORAL_TABLET | Freq: Every day | ORAL | 4 refills | Status: DC

## 2021-03-18 NOTE — Progress Notes (Signed)
Subjective:    Selena White is a 30 y.o. G63P2002 Caucasian female who presents for a postpartum visit. She is 6 weeks postpartum following a spontaneous vaginal delivery at [redacted]w[redacted]d gestational weeks. Anesthesia: none. I have fully reviewed the prenatal and intrapartum course.   Postpartum course has been uncomplicated.   Baby's course has been uncomplicated. Baby is feeding by breast.   Bleeding no bleeding. Bowel function is normal. Bladder function is normal.   Patient is not sexually active.  Contraception method is oral progesterone-only contraceptive.   Postpartum depression screening: negative. Score 1.    Last pap 4/22/21and was Neg.  Denies breathing difficulty, respiratory distress, chest pain, breast issues (clogged duct, red area, or flu- like symptoms), and leg pain or swelling.  The following portions of the patient's history were reviewed and updated as appropriate: allergies, current medications, past medical history, past surgical history and problem list.  Review of Systems  Pertinent items are noted in HPI.   Objective:   BP 113/71   Pulse 79   Ht 5\' 1"  (1.549 m)   Wt 68.4 kg   Breastfeeding Yes   BMI 28.47 kg/m   General:  alert, cooperative and no distress   Breasts:  deferred, no complaints  Lungs: clear to auscultation bilaterally  Heart:  regular rate and rhythm  Abdomen: soft, nontender   Vulva: normal  Vagina: normal vagina  Cervix:  closed  Corpus: Well-involuted  Adnexa:  Non-palpable      GAD 7 : Generalized Anxiety Score 03/18/2021 02/12/2021  Nervous, Anxious, on Edge 0 0  Control/stop worrying 0 0  Worry too much - different things 0 0  Trouble relaxing 0 0  Restless 0 0  Easily annoyed or irritable 0 0  Afraid - awful might happen 0 0  Total GAD 7 Score 0 0    Depression screen [facility] 2/9 03/18/2021 02/12/2021 01/22/2021 12/15/2017  Decreased Interest 0 0 0 0  Down, Depressed, Hopeless 0 0 0 0  PHQ - 2 Score 0 0 0 0  Altered sleeping 0 0 -  0  Tired, decreased energy 1 1 - 0  Change in appetite 0 0 - 0  Feeling bad or failure about yourself  0 0 - 0  Trouble concentrating 0 0 - 0  Moving slowly or fidgety/restless 0 0 - 0  Suicidal thoughts 0 0 - 0  PHQ-9 Score 1 1 - 0  Difficult doing work/chores Not difficult at all Not difficult at all - -   Assessment:   Postpartum exam  6 wks s/p vaginal delivery  Breastfeeding  Depression screening negative  Contraception counseling   Plan:   Encouraged routine health maintenance techniques.   Rx: Camila, see orders  Reviewed red flag symptoms and when to call.  RTC 04/2022 for ANNUAL EXAM with JML or sooner if needed.  05/2022, Student[employer] 03/18/21 3:39 PM

## 2021-03-18 NOTE — Progress Notes (Signed)
I have seen, interviewed, and examined the patient in conjunction with the [facility] [employer] and affirm the diagnosis and management plan.   Gunnar Bulla, CNM [facility], [facility] 03/18/21 5:32 PM

## 2021-03-18 NOTE — Patient Instructions (Signed)
Breastfeeding and Breast Care It is normal to have some problems when you start to breastfeed your new baby. But there are things that you can do to take care of yourself and help prevent problems. This includes keeping your breasts healthy and making sure that your baby's mouth attaches (latches) properly to your nipple for feedings. Work with your doctor or breastfeeding specialist to find what works best for you. How does self-care benefit me? If you keep your breasts healthy and you let your baby attach to your nipples in the right way, you will avoid these problems:  Cracked or sore nipples.  Breasts becoming overfilled with milk.  Plugged milk ducts.  Low milk supply.  Breast swelling or infection. How does self-care benefit my baby? By preventing problems with your breasts, you will ensure that your baby will feed well and will gain the right amount of weight. What actions can I take to care for myself during breastfeeding? Best ways to breastfeed  Always make sure that your baby latches properly to breastfeed.  Make sure that your baby is in a proper position. Try different breastfeeding positions to find one that works best for you and your baby.  Breastfeed when you feel like you need to make your breasts less full or when your baby shows signs of hunger. This is called "breastfeeding on demand."  Do not delay feedings.  Try to relax when it is time to feed your baby. This helps your body release milk from your breast.  To help increase milk flow, do these things before feeding: ? Remove a small amount of milk from your breast. Use a pump or squeeze with your hand. ? Apply warm, moist heat to your breast. Do this in the shower or use hand towels soaked with warm water. ? Massage your breasts. Do this when you are breastfeeding as well. Caring for your breasts  To help your breasts stay healthy and keep them from getting too dry: ? Avoid using soap on your  nipples. ? Let your nipples air-dry for 3-4 minutes after each feeding.  Do not use things like a hair dryer to dry your breasts. This can make the skin dry and will cause irritation and pain. ? Use only cotton bra pads to soak up breast milk that leaks. Change the pads if they become soaked with milk. If you use bra pads that can be thrown away, change them often. ? Put some lanolin on your nipples after breastfeeding. Pure lanolin does not need to be washed off your nipple before you feed your baby again. Pure lanolin is not harmful to your baby. ? Rub some breast milk into your nipples:  Use your hand to squeeze out a few drops of breast milk.  Gently massage the milk into your nipples.  Let your nipples air-dry.  Wear a supportive nursing bra. Avoid wearing: ? Tight clothing. ? Underwire bras or bras that put pressure on your breasts.  Use ice to help relieve pain or swelling of your breasts: ? Put ice in a plastic bag. ? Place a towel between your skin and the bag. ? Leave the ice on for 20 minutes, 2-3 times a day.      Follow these instructions at home:  Drink enough fluid to keep your pee (urine) pale yellow.  Get plenty of rest. Sleep when your baby sleeps.  Talk to your doctor or breastfeeding specialist before taking any herbal supplements.  Eat a balanced diet. This includes  includes fruits, vegetables, whole grains, lean proteins, and dairy or dairy alternatives Contact a health care provider if:  You have nipple pain.  You have cracking or soreness in your nipples that lasts longer than 1 week.  Your breasts are overfilled with milk, and this lasts longer than 48 hours.  You have a fever.  You have pus-like fluid coming from your nipple.  You have redness, a rash, swelling, itching, or burning on your breast.  Your baby does not gain weight.  Your baby loses weight.  Your baby is not feeding regularly or is very sleepy and lacks energy. Summary  There are  things that you can do to take care of yourself and help prevent many common breastfeeding problems.  Always make sure that your baby's mouth attaches (latches) to your nipple properly to breastfeed.  Keep your nipples from getting too dry, drink plenty of fluid, and get plenty of rest.  Feed on demand. Do not delay feedings. This information is not intended to replace advice given to you by your health care provider. Make sure you discuss any questions you have with your health care provider. Document Revised: 05/19/2020 Document Reviewed: 05/19/2020 Elsevier Patient Education  2021 Elsevier Inc.   Preventive Care 21-39 Years Old, Female Preventive care refers to lifestyle choices and visits with your health care provider that can promote health and wellness. This includes:  A yearly physical exam. This is also called an annual wellness visit.  Regular dental and eye exams.  Immunizations.  Screening for certain conditions.  Healthy lifestyle choices, such as: ? Eating a healthy diet. ? Getting regular exercise. ? Not using drugs or products that contain nicotine and tobacco. ? Limiting alcohol use. What can I expect for my preventive care visit? Physical exam Your health care provider may check your:  Height and weight. These may be used to calculate your BMI (body mass index). BMI is a measurement that tells if you are at a healthy weight.  Heart rate and blood pressure.  Body temperature.  Skin for abnormal spots. Counseling Your health care provider may ask you questions about your:  Past medical problems.  Family's medical history.  Alcohol, tobacco, and drug use.  Emotional well-being.  Home life and relationship well-being.  Sexual activity.  Diet, exercise, and sleep habits.  Work and work environment.  Access to firearms.  Method of birth control.  Menstrual cycle.  Pregnancy history. What immunizations do I need? Vaccines are usually given  at various ages, according to a schedule. Your health care provider will recommend vaccines for you based on your age, medical history, and lifestyle or other factors, such as travel or where you work.   What tests do I need? Blood tests  Lipid and cholesterol levels. These may be checked every 5 years starting at age 20.  Hepatitis C test.  Hepatitis B test. Screening  Diabetes screening. This is done by checking your blood sugar (glucose) after you have not eaten for a while (fasting).  STD (sexually transmitted disease) testing, if you are at risk.  BRCA-related cancer screening. This may be done if you have a family history of breast, ovarian, tubal, or peritoneal cancers.  Pelvic exam and Pap test. This may be done every 3 years starting at age 21. Starting at age 30, this may be done every 5 years if you have a Pap test in combination with an HPV test. Talk with your health care provider about your test results, treatment   options, and if necessary, the need for more tests.   Follow these instructions at home: Eating and drinking  Eat a healthy diet that includes fresh fruits and vegetables, whole grains, lean protein, and low-fat dairy products.  Take vitamin and mineral supplements as recommended by your health care provider.  Do not drink alcohol if: ? Your health care provider tells you not to drink. ? You are pregnant, may be pregnant, or are planning to become pregnant.  If you drink alcohol: ? Limit how much you have to 0-1 drink a day. ? Be aware of how much alcohol is in your drink. In the [place] one drink equals one 12 oz bottle of beer (355 mL), one 5 oz glass of wine (148 mL), or one 1 oz glass of hard liquor (44 mL).   Lifestyle  Take daily care of your teeth and gums. Brush your teeth every morning and night with fluoride toothpaste. Floss one time each day.  Stay active. Exercise for at least 30 minutes 5 or more days each week.  Do not use any products that  contain nicotine or tobacco, such as cigarettes, e-cigarettes, and chewing tobacco. If you need help quitting, ask your health care provider.  Do not use drugs.  If you are sexually active, practice safe sex. Use a condom or other form of protection to prevent STIs (sexually transmitted infections).  If you do not wish to become pregnant, use a form of birth control. If you plan to become pregnant, see your health care provider for a prepregnancy visit.  Find healthy ways to cope with stress, such as: ? Meditation, yoga, or listening to music. ? Journaling. ? Talking to a trusted person. ? Spending time with friends and family. Safety  Always wear your seat belt while driving or riding in a vehicle.  Do not drive: ? If you have been drinking alcohol. Do not ride with someone who has been drinking. ? When you are tired or distracted. ? While texting.  Wear a helmet and other protective equipment during sports activities.  If you have firearms in your house, make sure you follow all gun safety procedures.  Seek help if you have been physically or sexually abused. What's next?  Go to your health care provider once a year for an annual wellness visit.  Ask your health care provider how often you should have your eyes and teeth checked.  Stay up to date on all vaccines. This information is not intended to replace advice given to you by your health care provider. Make sure you discuss any questions you have with your health care provider. Document Revised: 07/26/2020 Document Reviewed: 08/09/2018 Elsevier Patient Education  2021 Elsevier Inc.  

## 2021-03-18 NOTE — Progress Notes (Signed)
   PT is present today for her postpartum visit. Pt stated that she is breastfeeding and stated not having any issues and have not had sexually intercourse recently. Pt stated that she would like to get OCP(mini pill) for birth control. UPT-NEG. PHQ-9=1. GAD-7= 0.  Pt stated that she is doing well no complaints.

## 2021-03-18 NOTE — Patient Instructions (Signed)
Medication Instructions:   Your physician recommends that you continue on your current medications as directed. Please refer to the Current Medication list given to you today.  *If you need a refill on your cardiac medications before your next appointment, please call your pharmacy*   Lab Work:  None ordered   Testing/Procedures:  None ordered   Follow-Up: At [facility], you and your health needs are our priority.  As part of our continuing mission to provide you with exceptional heart care, we have created designated Provider Care Teams.  These Care Teams include your primary Cardiologist (physician) and Advanced Practice Providers (APPs -  Physician Assistants and Nurse Practitioners) who all work together to provide you with the care you need, when you need it.  We recommend signing up for the patient portal called "MyChart".  Sign up information is provided on this After Visit Summary.  MyChart is used to connect with patients for Virtual Visits (Telemedicine).  Patients are able to view lab/test results, encounter notes, upcoming appointments, etc.  Non-urgent messages can be sent to your provider as well.   To learn more about what you can do with MyChart, go to [email or web address].    Your next appointment:   Follow up as needed   The format for your next appointment:   In Person  Provider:   Brian Agbor-Etang, MD   Other Instructions   

## 2021-03-18 NOTE — Progress Notes (Signed)
Cardiology Office Note:    Date:  03/18/2021   ID:  Selena White, DOB [birth year], MRN [id number]  PCP:  Patient, No Pcp Per (Inactive)  [facility] Cardiologist:  Debbe Odea, MD  [facility] Electrophysiologist:  None   Referring MD: No ref. provider found   Chief Complaint  Patient presents with  . Other    2 month follow up - Meds reviewed verbally with patient.     History of Present Illness:    Selena White is a 30 y.o. female with a hx of paroxysmal SVT, currently [redacted] weeks pregnant who presents for follow-up.  Previously seen for palpitations.  Cardiac monitor Showed occasional paroxysmal SVT and nonsustained VT lasting 10 beats. Echocardiogram was normal with no gross structural abnormalities.  Symptoms of palpitations occurred while patient was pregnant, and deemed secondary to increased volume/cardiac stretching.  She delivered a baby girl 2 weeks ago.  She denies any further symptoms since having her child.  Feels well, has no concerns at this time.  Denies chest pain, shortness of breath.  Wants to low risk for CAD due to her father having MI at age 79.  Echocardiogram 12/30/2020 normal systolic and diastolic function, EF 55 to 60%. Cardiac monitor 12/24/2020 10 beat nonsustained VT, occasional paroxysmal SVT  Past Medical History:  Diagnosis Date  . Arrhythmia   . Asthma   . Hand tendonitis   . Heavy periods   . Mixed hypoglycemia   . Overweight   . Painful menstrual periods   . UTI (lower urinary tract infection)     Past Surgical History:  Procedure Laterality Date  . WISDOM TOOTH EXTRACTION      Current Medications: Current Meds  Medication Sig  . Iron-FA-B Cmp-C-Biot-Probiotic (FUSION PLUS) CAPS Take 1 tablet by mouth daily.     Allergies:   Patient has no known allergies.   Social History   Socioeconomic History  . Marital status: Married    Spouse name: Not on file  . Number of children: Not on file  . Years of education: Not on  file  . Highest education level: Not on file  Occupational History    Employer: [employer]  Tobacco Use  . Smoking status: Never Smoker  . Smokeless tobacco: Never Used  Vaping Use  . Vaping Use: Never used  Substance and Sexual Activity  . Alcohol use: No    Alcohol/week: 0.0 standard drinks  . Drug use: No  . Sexual activity: Not Currently    Partners: Male  Other Topics Concern  . Not on file  Social History Narrative  . Not on file   Social Determinants of Health   Financial Resource Strain: Not on file  Food Insecurity: Not on file  Transportation Needs: Not on file  Physical Activity: Not on file  Stress: Not on file  Social Connections: Not on file     Family History: The patient's family history includes Cancer in her maternal grandmother; Diabetes in her father and maternal grandmother; Heart disease in her father; Heart failure in her father; Hypertension in her father; Hypothyroidism in her mother.  ROS:   Please see the history of present illness.     All other systems reviewed and are negative.  EKGs/Labs/Other Studies Reviewed:    The following studies were reviewed today:   EKG:  EKG is  ordered today.  The ekg ordered today demonstrates sinus tachycardia, otherwise normal ECG  Recent Labs: 04/02/2020: TSH 2.090 08/26/2020: ALT  12; BUN 6; Creatinine, Ser 0.46; Potassium 4.0; Sodium 136 01/30/2021: Hemoglobin 10.8; Platelets 127  Recent Lipid Panel No results found for: CHOL, TRIG, HDL, CHOLHDL, VLDL, LDLCALC, LDLDIRECT   Risk Assessment/Calculations:      Physical Exam:    VS:  BP 110/60 (BP Location: Left Arm, Patient Position: Sitting, Cuff Size: Normal)   Pulse 77   Ht 5\' 1"  (1.549 m)   Wt 150 lb (68 kg)   SpO2 98%   BMI 28.34 kg/m     Wt Readings from Last 3 Encounters:  03/18/21 150 lb (68 kg)  01/29/21 169 lb (76.7 kg)  01/29/21 169 lb (76.7 kg)     GEN:  Well nourished, well developed in no acute  distress HEENT: Normal NECK: No JVD; No carotid bruits LYMPHATICS: No lymphadenopathy CARDIAC: RRR, no murmurs, rubs, gallops RESPIRATORY:  Clear to auscultation without rales, wheezing or rhonchi  ABDOMEN: Soft, non-tender, distended MUSCULOSKELETAL:  No edema; No deformity  SKIN: Warm and dry NEUROLOGIC:  Alert and oriented x 3 PSYCHIATRIC:  Normal affect   ASSESSMENT:    1. PSVT (paroxysmal supraventricular tachycardia) (HCC)   2. Family history of early CAD    PLAN:    In order of problems listed above:  1. Palpitations while pregnant, cardiac monitor showing paroxysmal SVT, nonsustained VT lasting 10 beats. Echocardiogram showing normal systolic and diastolic function, EF 55 to 60%,.  Symptoms have resolved post pregnancy.  Etiology of arrhythmia is likely from cardiac stretching associated with volume increased in pregnancy.   2. History of CAD in father.  Patient denies chest pain or shortness of breath.  Indicated on risk factors, patient is young, has no cardiac risk factors.  She is low risk for cardiac/coronary events.  If she needs an additional risk prognosticator, calcium scan was offered.  She will let 01/31/21 know if she wants to obtain calcium scan.  She is low cardiac risk.  Follow-up as needed   Medication Adjustments/Labs and Tests Ordered: Current medicines are reviewed at length with the patient today.  Concerns regarding medicines are outlined above.  Orders Placed This Encounter  Procedures  . EKG 12-Lead   No orders of the defined types were placed in this encounter.   Patient Instructions  Medication Instructions:  Your physician recommends that you continue on your current medications as directed. Please refer to the Current Medication list given to you today. *If you need a refill on your cardiac medications before your next appointment, please call your pharmacy*   Lab Work: None ordered    Testing/Procedures: None ordered   Follow-Up: At  [facility], you and your health needs are our priority.  As part of our continuing mission to provide you with exceptional heart care, we have created designated Provider Care Teams.  These Care Teams include your primary Cardiologist (physician) and Advanced Practice Providers (APPs -  Physician Assistants and Nurse Practitioners) who all work together to provide you with the care you need, when you need it.  We recommend signing up for the patient portal called "MyChart".  Sign up information is provided on this After Visit Summary.  MyChart is used to connect with patients for Virtual Visits (Telemedicine).  Patients are able to view lab/test results, encounter notes, upcoming appointments, etc.  Non-urgent messages can be sent to your provider as well.   To learn more about what you can do with MyChart, go to [facility].    Your next appointment:   Follow up as  needed   The format for your next appointment:   In Person  Provider:   Debbe Odea, MD   Other Instructions      Signed, Debbe Odea, MD  03/18/2021 12:50 PM     [facility]

## 2021-05-20 NOTE — Telephone Encounter (Signed)
See message. Thanks, JML

## 2021-05-26 ENCOUNTER — Ambulatory Visit: Payer: Managed Care, Other (non HMO) | Admitting: Cardiology

## 2021-09-23 ENCOUNTER — Encounter: Payer: 59 | Admitting: Certified Nurse Midwife

## 2021-10-11 ENCOUNTER — Other Ambulatory Visit: Admit: 2021-10-11 | Discharge: 2021-10-11 | Payer: PRIVATE HEALTH INSURANCE | Attending: Maternal & Fetal Medicine

## 2021-10-11 ENCOUNTER — Ambulatory Visit: Admit: 2021-10-11 | Discharge: 2021-10-15 | Payer: PRIVATE HEALTH INSURANCE

## 2021-10-11 DIAGNOSIS — O34219 Maternal care for unspecified type scar from previous cesarean delivery: Secondary | ICD-10-CM

## 2021-10-11 DIAGNOSIS — O1493 Unspecified pre-eclampsia, third trimester: Secondary | ICD-10-CM

## 2021-10-11 LAB — POCT URINE QUALITATIVE DIPSTICK PROTEIN: Protein, UA: POSITIVE — AB

## 2021-10-11 LAB — POCT URINE QUALITATIVE DIPSTICK GLUCOSE: Glucose, UA POC: NEGATIVE

## 2021-10-15 NOTE — Progress Notes (Signed)
[facility] MATERNAL FETAL MEDICINE  [address]  [address]  [place], [place]. [address]  Ph: [phone] Fax: [phone]  October 11 2021     RE: Tara Mccormick 1991/11/21  Dear Dr. Wallace Cullens     We saw  [name]    for consultation and ultrasound  in the office at  [place], [place]  on 10/31/ 22       SUMMARY: REASSURING EXAM TODAY. PRECAUTIONS REVIEWED  FOLLOWUP YOUR OFFICE.    NEXT MFM APPT    2   weeks          OB History 30 yo Gravida 4. Para 2 @ [redacted]w[redacted]d    Risk Factors Gestational Diabetes-Diet controlled   Preeclampsia in Two Previous Pregnancies  Two Previous LT C-Sections  Cigarette Smoker      ~Note - VSS- Afebrile - no complaints     BP 122/86  DSUA Trace  proteinuria, neg glucosuria today    ~ Review of blood sugar log shows >90% of all measures within desired range, with no worrisome highs/ lows     ??? The patient had a nonstress test performed today which was reactive.  There was moderate variability with accelerations    ??? The patient had a detailed ultrasound performed today which was reassuring .   A detailed report is included in the EMR under the imaging tab from today's date.    1 Vertex female at 34w 5d by clinical EDD.   2. Anatomic survey performed as noted above. Anatomy was limited due to fetal position and advanced gestational age.   3. There is appropriate interval growth.   4. EFW is 52% at 2397 g. (5:5)   5. Amniotic fluid appeared normal.   6. Placenta is posterior, fundal without evidence of previa.   7. BPP 10/10.  8. Umbilical artery dopplers were within normal limits.   9. MCA dopplers show MoM of 1.02    Discussion     ??? Diabetes in pregnancy goals,  concerns and precautions reviewed with patient.   ??? Goals for  blood sugar log >90% of all measures within desired range, with no worrisome highs/ lows.   ??? Discussed dietary choices, drinking water to help with fullness/hunger feelings.  ??? Discussed as bit of activity after eating - a short walk, etc to help with peaks and crashes   ??? Discussed  calories/snacks/drinks spread out through the day and night to avoid highs/ dangerous  lows in maternal/ and fetal blood sugars, particularly overnight.   ??? Discussed better detailed test reporting- time of meals and testing / hungry/ did not finish/ feeling sick/ awake in middle of night/ any shaky/panicky low blood sugar incidents.     Third trimester concerns and precautions reviewed with patient. Discussed fetal movements and the role of antenatal testing to help optimize growth and development and help predict/ avoid stress. Discussed trouble signs to watch for.      Vaughan Basta, MD  Maternal Fetal Medicine    Follow-up  2-3   weeks -  with NST  if undelivered     Plan R C/S # 3 @ 39w- sooner if clinically indicated

## 2021-10-27 ENCOUNTER — Ambulatory Visit: Admit: 2021-10-27 | Payer: PRIVATE HEALTH INSURANCE

## 2021-10-27 ENCOUNTER — Ambulatory Visit: Admit: 2021-10-27 | Discharge: 2021-10-27 | Payer: PRIVATE HEALTH INSURANCE | Attending: Maternal & Fetal Medicine

## 2021-10-27 DIAGNOSIS — O134 Gestational [pregnancy-induced] hypertension without significant proteinuria, complicating childbirth: Secondary | ICD-10-CM

## 2021-10-27 DIAGNOSIS — Z3A36 36 weeks gestation of pregnancy: Secondary | ICD-10-CM

## 2021-10-27 LAB — POCT URINE QUALITATIVE DIPSTICK GLUCOSE: Glucose, UA POC: NEGATIVE

## 2021-10-27 LAB — POCT URINE QUALITATIVE DIPSTICK PROTEIN: Protein, UA: NEGATIVE

## 2021-10-27 MED ORDER — METHYLPREDNISOLONE 4 MG PO TBPK
4 MG | ORAL_TABLET | ORAL | 0 refills | Status: DC
Start: 2021-10-27 — End: 2021-10-30

## 2021-10-27 MED ORDER — HYDROXYZINE PAMOATE 25 MG PO CAPS
25 MG | ORAL_CAPSULE | Freq: Four times a day (QID) | ORAL | 0 refills | Status: DC | PRN
Start: 2021-10-27 — End: 2021-10-30

## 2021-10-27 NOTE — Progress Notes (Signed)
Pt here for pregnancy ultrasound  Pt went to labor and delivery last evening as her bp elevated at home-she was there an hour and they sent her home  Pt states good fetal movement  Pt denies any bleeding/cramping  Pt scheduled for c-section December 1st in [place]

## 2021-10-27 NOTE — Patient Instructions (Signed)
Please arrive for your scheduled appointment at least 15 minutes early with your actual insurance card+ a photo ID. Also if you need any refills ordered or have questions, it may take up 48 hours to reply. Please allow ample time for your refills. Call me when you use last refill. Thank you for your cooperation.  You might be having an NST at your next appt. Please eat a large snack or breakfast before coming to office. Thank you Call your primary obstetrician with bleeding, leaking of fluid, abdominal tenderness, headache, blurry vision, epigastric pain and increased urinary frequency. If you are experiencing an emergency and need immediate help, call 911 or go to go emergency room or labor and delivery.  Do kick counts after dinner. Call your primary obstetrician if less than 10 kicks in 2 hours after dinner.     Call your primary obstetrician with bleeding, leaking of fluid, abdominal tenderness, headache, blurry vision, epigastric pain and increased urinary frequency. if you are sick, not feeling well or have an infectious process going on please reschedule your appointment by calling [phone]. Also if any family members are not feeling well, please do not bring them to your appointment. We appreciate your cooperation. We are doing this in order to protect our pregnant mothers+ their babies. if you are sick, not feeling well or have an infectious process going on please reschedule your appointment by calling [phone]. Also if any family members are not feeling well, please do not bring them to your appointment. We appreciate your cooperation. We are doing this in order to protect our pregnant mothers+ their babies.

## 2021-10-27 NOTE — Progress Notes (Signed)
[facility] MATERNAL FETAL MEDICINE  [address]  [address]  [place], [place]. [address]  Ph: [phone] Fax: [phone]    October 27, 2021      RE: Tara Mccormick Nov 22, 1991  Dear Dr. Wallace Cullens      We saw  [name]    for consultation and ultrasound  in the office at  [place], [place]  on 11/16/ 22         SUMMARY: REASSURING  Baby EXAM TODAY.   1. Pt has marked reddened hives,  rash on face- itching, no lesions  2. No s/sx preeclampsia-   3. Rx for Vistaril and Medrol Dosepak - call us back tomorrow  PRECAUTIONS REVIEWED  FOLLOWUP YOUR OFFICE.     NEXT MFM APPT 1 weeks             OB History 30 yo Gravida 4. Para 2 @ [redacted]w[redacted]d    Risk Factors Gestational Diabetes-Diet controlled   Preeclampsia in Two Previous Pregnancies  Two Previous LT C-Sections  Cigarette Smoker      ~Note - VSS- Afebrile - no complaints     BP 122/86  DSUA Trace  proteinuria, neg glucosuria today     ~ Review of blood sugar log shows >90% of all measures within desired range, with no worrisome highs/ lows      ??? The patient had a nonstress test performed today which was reactive.  There was moderate variability with accelerations     ??? The patient had a detailed ultrasound performed today which was reassuring .   A detailed report is included in the EMR under the imaging tab from today's date.     1. Active vertex female at 37w 0d.   2. Amniotic fluid appeared normal amount.   3. BPP score of 10/10.  4. Umbilical artery dopplers were within normal limits.   5. MCA doppler shows MoM of 0.92.     Discussion      ??? Diabetes in pregnancy goals,  concerns and precautions reviewed with patient.   ??? Goals for  blood sugar log >90% of all measures within desired range, with no worrisome highs/ lows.   ??? Discussed dietary choices, drinking water to help with fullness/hunger feelings.  ??? Discussed as bit of activity after eating - a short walk, etc to help with peaks and crashes   ??? Discussed calories/snacks/drinks spread out through the day and night to avoid highs/  dangerous  lows in maternal/ and fetal blood sugars, particularly overnight.   ??? Discussed better detailed test reporting- time of meals and testing / hungry/ did not finish/ feeling sick/ awake in middle of night/ any shaky/panicky low blood sugar incidents.      Third trimester concerns and precautions reviewed with patient. Discussed fetal movements and the role of antenatal testing to help optimize growth and development and help predict/ avoid stress. Discussed trouble signs to watch for.        Vaughan Basta, MD  Maternal Fetal Medicine    Follow-up  1    weeks -  with NST  if undelivered      Plan R C/S # 3 @ 39w- sooner if clinically indicated

## 2021-10-28 ENCOUNTER — Inpatient Hospital Stay
Admit: 2021-10-28 | Discharge: 2021-10-30 | Disposition: A | Discharge status: Home / Self Care | Payer: PRIVATE HEALTH INSURANCE | Source: Ambulatory Visit | Attending: Obstetrics & Gynecology | Admitting: Obstetrics & Gynecology

## 2021-10-28 DIAGNOSIS — Z8759 Personal history of other complications of pregnancy, childbirth and the puerperium: Secondary | ICD-10-CM

## 2021-10-28 LAB — URINALYSIS
Blood, Urine: NEGATIVE
Glucose, Ur: NEGATIVE mg/dL
Leukocyte Esterase, Urine: NEGATIVE
Nitrite, Urine: NEGATIVE
Specific Gravity, UA: >=1.03 (ref 1.005–1.030)
Urobilinogen, Urine: 2 E.U./dL — AB (ref <2.0)
pH, UA: 6 (ref 5.0–9.0)

## 2021-10-28 LAB — URINE DRUG SCREEN
Amphetamine Screen, Urine: NOT DETECTED (ref <1000)
Barbiturate Screen, Ur: NOT DETECTED (ref <200)
Benzodiazepine Screen, Urine: NOT DETECTED (ref <200)
Cannabinoid Scrn, Ur: NOT DETECTED
Cocaine Metabolite Screen, Urine: NOT DETECTED (ref <300)
FENTANYL SCREEN, URINE: NOT DETECTED (ref <1)
Methadone Screen, Urine: NOT DETECTED (ref <300)
Opiate Scrn, Ur: NOT DETECTED
Oxycodone Urine: NOT DETECTED (ref <100)
PCP Screen, Urine: NOT DETECTED (ref <25)

## 2021-10-28 LAB — URIC ACID: Uric Acid: 3.8 mg/dL (ref 2.4–5.7)

## 2021-10-28 LAB — PROTIME-INR
INR: 0.9
Protime: 10.1 s (ref 9.3–12.4)

## 2021-10-28 LAB — D-DIMER, QUANTITATIVE: D-Dimer, Quant: 311 ng/mL DDU

## 2021-10-28 LAB — CBC
Hematocrit: 39.9 % (ref 34.0–48.0)
Hemoglobin: 13.2 g/dL (ref 11.5–15.5)
MCH: 29.2 pg (ref 26.0–35.0)
MCHC: 33.1 % (ref 32.0–34.5)
MCV: 88.3 fL (ref 80.0–99.9)
MPV: 10.8 fL (ref 7.0–12.0)
Platelets: 332 E9/L (ref 130–450)
RBC: 4.52 E12/L (ref 3.50–5.50)
RDW: 13.8 fL (ref 11.5–15.0)
WBC: 8.3 E9/L (ref 4.5–11.5)

## 2021-10-28 LAB — COMPREHENSIVE METABOLIC PANEL
ALT: 94 U/L — ABNORMAL HIGH (ref 0–32)
AST: 59 U/L — ABNORMAL HIGH (ref 0–31)
Albumin: 3.4 g/dL — ABNORMAL LOW (ref 3.5–5.2)
Alkaline Phosphatase: 279 U/L — ABNORMAL HIGH (ref 35–104)
Anion Gap: 12 mmol/L (ref 7–16)
BUN: 9 mg/dL (ref 6–20)
CO2: 20 mmol/L — ABNORMAL LOW (ref 22–29)
Calcium: 9.6 mg/dL (ref 8.6–10.2)
Chloride: 101 mmol/L (ref 98–107)
Creatinine: 0.5 mg/dL (ref 0.5–1.0)
Est, Glom Filt Rate: >60 mL/min/{1.73_m2} (ref >=60)
Glucose: 91 mg/dL (ref 74–99)
Potassium: 4.1 mmol/L (ref 3.5–5.0)
Sodium: 133 mmol/L (ref 132–146)
Total Bilirubin: 0.5 mg/dL (ref 0.0–1.2)
Total Protein: 7.4 g/dL (ref 6.4–8.3)

## 2021-10-28 LAB — PROTEIN / CREATININE RATIO, URINE
Creatinine, Ur: 214 mg/dL (ref 29–226)
Protein, Ur: 53 mg/dL — ABNORMAL HIGH (ref 0–12)
Protein/Creat Ratio: 0.2
Protein/Creat Ratio: 0.2 (ref 0.0–0.2)

## 2021-10-28 LAB — SPECIMEN REJECTION

## 2021-10-28 LAB — MICROSCOPIC URINALYSIS

## 2021-10-28 LAB — APTT: aPTT: 28.3 s (ref 24.5–35.1)

## 2021-10-28 LAB — FIBRINOGEN: Fibrinogen: >700 mg/dL — ABNORMAL HIGH (ref 200–400)

## 2021-10-28 LAB — TYPE AND SCREEN
ABO/Rh: O POS
Antibody Screen: NEGATIVE

## 2021-10-28 MED ORDER — BUPIVACAINE IN DEXTROSE 0.75-8.25 % IT SOLN
INTRATHECAL | Status: DC | PRN
Start: 2021-10-28 — End: 2021-10-28
  Administered 2021-10-28: 23:00:00 1.6

## 2021-10-28 MED ORDER — NORMAL SALINE FLUSH 0.9 % IV SOLN
0.9 % | Freq: Two times a day (BID) | INTRAVENOUS | Status: DC
Start: 2021-10-28 — End: 2021-10-28

## 2021-10-28 MED ORDER — NORMAL SALINE FLUSH 0.9 % IV SOLN
0.9 % | INTRAVENOUS | Status: DC | PRN
Start: 2021-10-28 — End: 2021-10-28

## 2021-10-28 MED ORDER — MORPHINE SULFATE (PF) 1 MG/ML IJ SOLN
1 MG/ML | INTRAMUSCULAR | Status: AC
Start: 2021-10-28 — End: ?

## 2021-10-28 MED ORDER — DIPHENHYDRAMINE HCL 50 MG/ML IJ SOLN
50 MG/ML | INTRAMUSCULAR | Status: DC | PRN
Start: 2021-10-28 — End: 2021-10-28
  Administered 2021-10-28: 12.5 via INTRAVENOUS

## 2021-10-28 MED ORDER — LACTATED RINGERS IV SOLN
INTRAVENOUS | Status: DC
Start: 2021-10-28 — End: 2021-10-28
  Administered 2021-10-28: 21:00:00 via INTRAVENOUS

## 2021-10-28 MED ORDER — PHENYLEPHRINE HCL 1 MG/10ML IV SOSY
1 MG/0ML | INTRAVENOUS | Status: AC
Start: 2021-10-28 — End: ?

## 2021-10-28 MED ORDER — MORPHINE SULFATE (PF) 1 MG/ML IJ SOLN
1 MG/ML | INTRAMUSCULAR | Status: DC | PRN
Start: 2021-10-28 — End: 2021-10-28
  Administered 2021-10-28: 23:00:00 .15 via INTRASPINAL

## 2021-10-28 MED ORDER — ONDANSETRON HCL 4 MG/2ML IJ SOLN
4 MG/2ML | INTRAMUSCULAR | Status: DC | PRN
Start: 2021-10-28 — End: 2021-10-28
  Administered 2021-10-28: 4 via INTRAVENOUS

## 2021-10-28 MED ORDER — SODIUM CHLORIDE 0.9 % IV SOLN
0.9 % | INTRAVENOUS | Status: DC | PRN
Start: 2021-10-28 — End: 2021-10-28

## 2021-10-28 MED ORDER — DIPHENHYDRAMINE HCL 50 MG/ML IJ SOLN
50 MG/ML | INTRAMUSCULAR | Status: AC
Start: 2021-10-28 — End: ?

## 2021-10-28 MED ORDER — ONDANSETRON HCL 4 MG/2ML IJ SOLN
4 MG/2ML | INTRAMUSCULAR | Status: AC
Start: 2021-10-28 — End: ?

## 2021-10-28 MED ORDER — SOD CITRATE-CITRIC ACID 500-334 MG/5ML PO SOLN
500-334 MG/5ML | Freq: Once | ORAL | Status: AC
Start: 2021-10-28 — End: 2021-10-28
  Administered 2021-10-28: 23:00:00 30 mL via ORAL

## 2021-10-28 MED ORDER — OXYTOCIN 30 UNITS IN 500 ML INFUSION
30 UNIT/500ML | INTRAVENOUS | Status: DC | PRN
Start: 2021-10-28 — End: 2021-10-28
  Administered 2021-10-28: 909 via INTRAVENOUS

## 2021-10-28 MED ORDER — STERILE WATER FOR INJECTION (MIXTURES ONLY)
2 g | Freq: Once | INTRAMUSCULAR | Status: AC
Start: 2021-10-28 — End: 2021-10-28
  Administered 2021-10-28: 23:00:00 2000 mg via INTRAVENOUS

## 2021-10-28 MED ORDER — LACTATED RINGERS IV BOLUS
Freq: Once | INTRAVENOUS | Status: DC
Start: 2021-10-28 — End: 2021-10-28

## 2021-10-28 MED ORDER — OXYTOCIN 15 UNITS IN 250 ML INFUSION
15 UNIT/250ML | INTRAVENOUS | Status: AC
Start: 2021-10-28 — End: 2021-10-28

## 2021-10-28 MED ORDER — ACETAMINOPHEN 325 MG PO TABS
325 MG | ORAL | Status: DC | PRN
Start: 2021-10-28 — End: 2021-10-28

## 2021-10-28 MED ORDER — ACETAMINOPHEN 325 MG PO TABS
325 MG | ORAL | Status: AC
Start: 2021-10-28 — End: 2021-10-28
  Administered 2021-10-28: 20:00:00 650 via ORAL

## 2021-10-28 MED ORDER — PHENYLEPHRINE HCL 1 MG/10ML IV SOSY
110 MG/0ML | INTRAVENOUS | Status: DC | PRN
Start: 2021-10-28 — End: 2021-10-28
  Administered 2021-10-28 (×6): 100 via INTRAVENOUS

## 2021-10-28 MED ORDER — LACTATED RINGERS IV SOLN
INTRAVENOUS | Status: DC | PRN
Start: 2021-10-28 — End: 2021-10-28
  Administered 2021-10-28 – 2021-10-29 (×3): via INTRAVENOUS

## 2021-10-28 MED FILL — OXYTOCIN 15 UNITS IN 250 ML INFUSION: 15 UNIT/250ML | INTRAVENOUS | Qty: 250

## 2021-10-28 MED FILL — ONDANSETRON HCL 4 MG/2ML IJ SOLN: 4 MG/2ML | INTRAMUSCULAR | Qty: 2

## 2021-10-28 MED FILL — CEFAZOLIN SODIUM 2 G IJ SOLR: 2 g | INTRAMUSCULAR | Qty: 2000

## 2021-10-28 MED FILL — MORPHINE SULFATE (PF) 1 MG/ML IJ SOLN: 1 MG/ML | INTRAMUSCULAR | Qty: 10

## 2021-10-28 MED FILL — SOD CITRATE-CITRIC ACID 500-334 MG/5ML PO SOLN: 500-334 MG/5ML | ORAL | Qty: 30

## 2021-10-28 MED FILL — MAPAP 325 MG PO TABS: 325 MG | ORAL | Qty: 2

## 2021-10-28 MED FILL — PHENYLEPHRINE HCL 1 MG/10ML IV SOSY: 1 MG/0ML | INTRAVENOUS | Qty: 10

## 2021-10-28 MED FILL — DIPHENHYDRAMINE HCL 50 MG/ML IJ SOLN: 50 MG/ML | INTRAMUSCULAR | Qty: 1

## 2021-10-28 NOTE — Progress Notes (Signed)
Repeat LTCS delivery of viable baby boy at 55. Delayed cord clamping done. APGARS 8/8.

## 2021-10-28 NOTE — Progress Notes (Signed)
G4p2 presents for complaints of a headache and swelling. Pt sees Dr Wallace Cullens in [place]. Pt states she went to the hospital Tuesday for these complaints and was given tylenol and sent home. Pt was at dr Zannie Kehr yesterday and was given Vistaril and Medrol dosepack for hives and swelling.   Pt states headache got worse with stomach pain so she called Dr Roseanne Reno who advised her to come here.   Pt denies any leaking of fluid or bleeding and denies any contractions.   House officer and house PA aware of pt.

## 2021-10-28 NOTE — Progress Notes (Signed)
Assumed care.

## 2021-10-28 NOTE — Progress Notes (Signed)
OK for tylenol per Dr Jory Sims

## 2021-10-28 NOTE — Procedures (Signed)
Department of Obstetrics and Gynecology  Obstetrical Brief Operative Report        Pre-operative Diagnosis:  IUP @  37 Weeks 0 Days, two prior cesarean sections, preeclampsia      Post-operative Diagnosis:  SAME    Procedure:  repeat low transverse cesarean section    Surgeon:  Dr. Franchot Erichsen         Anesthesia:  Spinal anesthesia    Findings:      Live Born  Sex:  Female  Fetal Position:  Cephalic, occiput anterior  Apgars:  1 minute:  8; 5 minute:  8  Weight:  2700 gms  Tubes, uterus, ovaries:  wnl    Total IV fluids:  2000 ml    Urine Output:  250 ml clear    Estimated blood loss:     Specimens:  cord blood and gases    Complications:  none    Condition:    Infant stable, transfer to General Care Nursery  Mother stable, transfer to post anesthesia recovery      See dictated operative report for full details.

## 2021-10-28 NOTE — Progress Notes (Signed)
Patient admitted into room 317 and oriented to surroundings. Introduced self and wrote this RN's name and phone extension on patient's white board. Phone and nurse's call light at patient's bedside and instructed to use for any needs. Patient instructed on new admission informational packet at bedside with information on infant testing to be done when infant is 24 hours old including ODH labs, 24 hours blood sugar, and CCHD. Patient instructed on mom baby unit policies and procedures including need to keep infant in bassinet for transport in hallways and for infant to sleep alone, on back, in an empty bassinet. Patient also instructed patient on unit visitation policy and that one same support person 72 years old or older may stay overnight if desired. Patient verbalized understanding of all of the above. Pt. Refused flu vaccine and has already gotten the tdap

## 2021-10-28 NOTE — Anesthesia Procedure Notes (Signed)
Spinal Block    Patient location during procedure: OR  End time: 10/28/2021 6:24 PM  Reason for block: primary anesthetic and at surgeon's request  Staffing  Performed: resident/CRNA   Anesthesiologist: Jeni Salles., MD  Resident/CRNA: Waynetta Sandy, APRN - CRNA  Spinal Block  Patient position: sitting  Prep: ChloraPrep  Patient monitoring: cardiac monitor, continuous pulse ox, continuous capnometry and frequent blood pressure checks  Approach: midline  Location: L3/L4  Provider prep: mask, sterile gloves and sterile gown  Local infiltration: lidocaine  Needle  Needle type: Pencan   Needle gauge: 24 G  Needle length: 3.5 in  Assessment  Sensory level: T6  Swirl obtained: Yes  CSF: clear  Attempts: 1  Hemodynamics: stable  Preanesthetic Checklist  Completed: patient identified, IV checked, site marked, risks and benefits discussed, surgical/procedural consents, equipment checked, pre-op evaluation, timeout performed, anesthesia consent given, oxygen available and monitors applied/VS acknowledged

## 2021-10-28 NOTE — Progress Notes (Signed)
Patient seen  Iup at 37 wks  Two prior cesarean sections  Has been seen by mfm  History of preeclampsia  Bp okay but lft;s elevated  Fht's reactive  Dr Roseanne Reno recommended delivery today. For repeat c/s. Patient agrees to proceed

## 2021-10-28 NOTE — Telephone Encounter (Signed)
Pt called in stating that she feels worse today even with the medication Dr Roseanne Reno ordered and that she has a headache and stomach pain.  Dr Roseanne Reno informed and recommends that pt go to the hospital for evaluation and probable delivery.   She was informed and verbalized understanding.

## 2021-10-28 NOTE — Anesthesia Pre-Procedure Evaluation (Signed)
Department of Anesthesiology  Preprocedure Note       Name:  Tara Mccormick   Age:  30 y.o.  DOB:  1991/05/02                                          MRN:  [id number]         Date:  10/28/2021      Surgeon: Moishe Spice):  Minda Meo, MD    Procedure: Procedure(s):  CESAREAN SECTION    Medications prior to admission:   Prior to Admission medications    Medication Sig Start Date End Date Taking? Authorizing Provider   hydrOXYzine pamoate (VISTARIL) 25 MG capsule Take 1 capsule by mouth 4 times daily as needed for Itching or Anxiety (will make you drowsy) 10/27/21 11/10/21  Gordy Levan, MD   methylPREDNISolone (MEDROL, PAK,) 4 MG tablet Take 1 tablet by mouth daily for 2 days, THEN 0.5 tablets daily for 4 days. Take by mouth.. 10/27/21 11/02/21  Gordy Levan, MD   aspirin 81 MG chewable tablet Take 81 mg by mouth daily    Historical Provider, MD   ibuprofen (ADVIL;MOTRIN) 800 MG tablet Take 1 tablet by mouth every 8 hours as needed for Pain  Patient not taking: No sig reported 08/23/17   Nichola Sizer, MD   docusate sodium (COLACE, DULCOLAX) 100 MG CAPS Take 100 mg by mouth 2 times daily  Patient not taking: No sig reported 08/23/17   Nichola Sizer, MD   simethicone (MYLICON) 80 MG chewable tablet Take 1 tablet by mouth every 6 hours as needed for Flatulence  Patient not taking: No sig reported 08/23/17   Nichola Sizer, MD   acetaminophen (TYLENOL) 325 MG tablet Take 650 mg by mouth as needed for Pain  Patient not taking: No sig reported    Historical Provider, MD   Prenatal Vit-Fe Fumarate-FA (PRENATAL 1+1 PO) Take 1 capsule by mouth daily    Historical Provider, MD       Current medications:    Current Facility-Administered Medications   Medication Dose Route Frequency Provider Last Rate Last Admin   ??? acetaminophen (TYLENOL) tablet 650 mg  650 mg Oral Q4H PRN Minda Meo, MD   650 mg at 10/28/21 1435   ??? lactated ringers infusion   IntraVENous Continuous Minda Meo, MD 125 mL/hr at  10/28/21 1606 New Bag at 10/28/21 1606   ??? lactated ringers bolus  1,000 mL IntraVENous Once Minda Meo, MD       ??? sodium chloride flush 0.9 % injection 10 mL  10 mL IntraVENous 2 times per day Minda Meo, MD       ??? sodium chloride flush 0.9 % injection 10 mL  10 mL IntraVENous PRN Minda Meo, MD       ??? 0.9 % sodium chloride infusion   IntraVENous PRN Minda Meo, MD       ??? citric acid-sodium citrate (BICITRA) solution 30 mL  30 mL Oral Once Minda Meo, MD       ??? ceFAZolin (ANCEF) 2,000 mg in sterile water 20 mL IV syringe  2,000 mg IntraVENous Once Minda Meo, MD           Allergies:  No Known Allergies    Problem List:    Patient Active Problem List   Diagnosis  Code   ??? [redacted] weeks gestation of pregnancy Z3A.29   ??? Pregnant and not yet delivered in third trimester Z34.93   ??? Single delivery by C-section O82   ??? [redacted] weeks gestation of pregnancy Z3A.37   ??? Previous cesarean delivery affecting pregnancy, antepartum O34.219   ??? Pregnancy-induced hypertension in third trimester O13.3   ??? Elevated blood pressure affecting pregnancy in third trimester, antepartum O16.3   ??? H/O pre-eclampsia in prior pregnancy, currently pregnant, third trimester O09.293   ??? Cesarean deliv due to previous difficult deliv, deliv, curr hospitaliz O82, Z87.59       Past Medical History:        Diagnosis Date   ??? Gestational diabetes mellitus    ??? Mental disorder     anxiety   ??? Pregnancy-induced hypertension in third trimester        Past Surgical History:        Procedure Laterality Date   ??? CESAREAN SECTION, LOW TRANSVERSE     ??? OTHER SURGICAL HISTORY  30yrs old and 30 yrs old    tubes in ears   ??? PR CESAREAN DELIVERY ONLY N/A 08/21/2017    CESAREAN SECTION performed by Alesia Morin, MD at [facility]       Social History:    Social History     Tobacco Use   ??? Smoking status: Every Day     Packs/day: 0.25     Types: Cigarettes     Start date: 06/27/2009   ??? Smokeless tobacco: Never   Substance Use  Topics   ??? Alcohol use: No                                Ready to quit: Not Answered  Counseling given: Not Answered      Vital Signs (Current):   Vitals:    10/28/21 1424 10/28/21 1439 10/28/21 1454 10/28/21 1509   BP: 115/66 122/76 111/69 111/69   Pulse: 80 82 84 78                                              BP Readings from Last 3 Encounters:   10/28/21 111/69   10/27/21 123/84   10/11/21 122/86       NPO Status:                                                                                 BMI:   Wt Readings from Last 3 Encounters:   10/27/21 165 lb (74.8 kg)   10/11/21 163 lb (73.9 kg)   08/21/17 172 lb (78 kg)     There is no height or weight on file to calculate BMI.    CBC:   Lab Results   Component Value Date/Time    WBC 8.3 10/28/2021 12:45 PM    RBC 4.52 10/28/2021 12:45 PM    HGB 13.2 10/28/2021 12:45 PM    HCT 39.9 10/28/2021 12:45 PM    MCV 88.3 10/28/2021 12:45  PM    RDW 13.8 10/28/2021 12:45 PM    PLT 332 10/28/2021 12:45 PM       CMP:   Lab Results   Component Value Date/Time    NA 133 10/28/2021 12:45 PM    K 4.1 10/28/2021 12:45 PM    CL 101 10/28/2021 12:45 PM    CO2 20 10/28/2021 12:45 PM    BUN 9 10/28/2021 12:45 PM    CREATININE 0.5 10/28/2021 12:45 PM    GFRAA >60 08/22/2017 05:25 AM    LABGLOM >60 10/28/2021 12:45 PM    GLUCOSE 91 10/28/2021 12:45 PM    PROT 7.4 10/28/2021 12:45 PM    CALCIUM 9.6 10/28/2021 12:45 PM    BILITOT 0.5 10/28/2021 12:45 PM    ALKPHOS 279 10/28/2021 12:45 PM    AST 59 10/28/2021 12:45 PM    ALT 94 10/28/2021 12:45 PM       POC Tests: No results for input(s): POCGLU, POCNA, POCK, POCCL, POCBUN, POCHEMO, POCHCT in the last 72 hours.    Coags:   Lab Results   Component Value Date/Time    PROTIME 10.1 10/28/2021 03:47 PM    INR 0.9 10/28/2021 03:47 PM    APTT 28.3 10/28/2021 03:47 PM       HCG (If Applicable): No results found for: PREGTESTUR, PREGSERUM, HCG, HCGQUANT     ABGs: No results found for: PHART, PO2ART, PCO2ART, HCO3ART, BEART, O2SATART     Type & Screen  (If Applicable):  No results found for: LABABO, LABRH    Drug/Infectious Status (If Applicable):  No results found for: HIV, HEPCAB    COVID-19 Screening (If Applicable): No results found for: COVID19        Anesthesia Evaluation  Patient summary reviewed and Nursing notes reviewed  Airway: Mallampati: II  TM distance: >3 FB   Neck ROM: full  Mouth opening: > = 3 FB   Dental:          Pulmonary:normal exam  breath sounds clear to auscultation  (+) current smoker          Patient smoked on day of surgery.                ROS comment: Smokes 1/2 PPD x 15 years.   Cardiovascular:  Exercise tolerance: good (>4 METS),   (+) hypertension: moderate,         Rhythm: regular  Rate: normal           Beta Blocker:  Not on Beta Blocker         Neuro/Psych:   (+) psychiatric history:depression/anxiety             GI/Hepatic/Renal: Neg GI/Hepatic/Renal ROS            Endo/Other:    (+) DiabetesType II DM, , blood dyscrasia: anemia:., .                  ROS comment:  Diet controlled. Abdominal:             Vascular: negative vascular ROS.         Other Findings:           Anesthesia Plan      general and spinal     ASA 3     (Discussed labor options with patient Questions answered. Patient agrees to spinal with GA as back up.)      MIPS: Postoperative opioids intended and Prophylactic antiemetics administered.  Anesthetic plan and risks  discussed with patient.      Plan discussed with attending.                CBC   Lab Results   Component Value Date/Time    WBC 8.3 10/28/2021 12:45 PM    RBC 4.52 10/28/2021 12:45 PM    HGB 13.2 10/28/2021 12:45 PM    HCT 39.9 10/28/2021 12:45 PM    MCV 88.3 10/28/2021 12:45 PM    RDW 13.8 10/28/2021 12:45 PM    PLT 332 10/28/2021 12:45 PM     CMP    Lab Results   Component Value Date/Time    NA 133 10/28/2021 12:45 PM    K 4.1 10/28/2021 12:45 PM    CL 101 10/28/2021 12:45 PM    CO2 20 10/28/2021 12:45 PM    BUN 9 10/28/2021 12:45 PM    CREATININE 0.5 10/28/2021 12:45 PM    GFRAA >60 08/22/2017  05:25 AM    LABGLOM >60 10/28/2021 12:45 PM    GLUCOSE 91 10/28/2021 12:45 PM    PROT 7.4 10/28/2021 12:45 PM    CALCIUM 9.6 10/28/2021 12:45 PM    BILITOT 0.5 10/28/2021 12:45 PM    ALKPHOS 279 10/28/2021 12:45 PM    AST 59 10/28/2021 12:45 PM    ALT 94 10/28/2021 12:45 PM     BMP    Lab Results   Component Value Date/Time    NA 133 10/28/2021 12:45 PM    K 4.1 10/28/2021 12:45 PM    CL 101 10/28/2021 12:45 PM    CO2 20 10/28/2021 12:45 PM    BUN 9 10/28/2021 12:45 PM    CREATININE 0.5 10/28/2021 12:45 PM    CALCIUM 9.6 10/28/2021 12:45 PM    GFRAA >60 08/22/2017 05:25 AM    LABGLOM >60 10/28/2021 12:45 PM    GLUCOSE 91 10/28/2021 12:45 PM     POCGlucose  Recent Labs     10/28/21  1245   GLUCOSE 91        Coags  Lab Results   Component Value Date/Time    PROTIME 10.1 10/28/2021 03:47 PM    INR 0.9 10/28/2021 03:47 PM    APTT 28.3 10/28/2021 03:47 PM       HCG (If Applicable) No results found for: PREGTESTUR, PREGSERUM, HCG, HCGQUANT     ABGs   No results found for: PHART, PO2ART, PCO2ART, HCO3ART, BEART, O2SATART     Type & Screen (If Applicable)  Lab Results   Component Value Date    ABORH O POS 10/28/2021     Active Problem List with ICD10 Codes  Patient Active Problem List   Diagnosis Code   ??? [redacted] weeks gestation of pregnancy Z3A.29   ??? Pregnant and not yet delivered in third trimester Z34.93   ??? Single delivery by C-section O82   ??? [redacted] weeks gestation of pregnancy Z3A.37   ??? Previous cesarean delivery affecting pregnancy, antepartum O34.219   ??? Pregnancy-induced hypertension in third trimester O13.3   ??? Elevated blood pressure affecting pregnancy in third trimester, antepartum O16.3   ??? H/O pre-eclampsia in prior pregnancy, currently pregnant, third trimester O09.293   ??? Cesarean deliv due to previous difficult deliv, deliv, curr hospitaliz O82, Z87.59       Waynetta Sandy, APRN - CRNA  October 28, 2021  5:43 PM      Waynetta Sandy, APRN - CRNA   10/28/2021

## 2021-10-28 NOTE — H&P (Signed)
Department of Obstetrics and Gynecology  Physician Assistant Obstetrics History and Physical      HISTORY OF PRESENT ILLNESS:      The patient is a 30 y.o. gravida 4 parity 2 at 31 weeks' 0 days' gestation presents to L&D from out of town for [facility] work-up. Saw MFM yesterday. Has a headache and mild swelling. Patient denies RUQ or epigastric pain or visual changes.    Current obstetric history is significant for:  Elevated blood pressure complicating pregnancy, third trimester   Gestational Diabetes-Diet controlled   Preeclampsia in Two Previous Pregnancies  Two Previous C-Sections  Cigarette Smoker    Estimated Due Date:  11/18/2021  Contractions: No  Leaking of fluid: No  Bleeding:  No  Perceived fetal movement: Good        PAST OB HISTORY:  OB History   Gravida Para Term Preterm AB Living   4 2 2   1 2    SAB IAB Ectopic Molar Multiple Live Births   0       0 2      # Outcome Date GA Lbr Len/2nd Weight Sex Delivery Anes PTL Lv   4 Current            3 Term 08/21/17 [redacted]w[redacted]d  6 lb 4.5 oz (2.85 kg) F CS-LTranv Spinal N LIV      Birth Comments: repeat c/s, preeclampsia      Complications: Preeclampsia   2 AB 06/28/11           1 Term 01/17/10 [redacted]w[redacted]d  7 lb 5 oz (3.317 kg) F CS-LTranv   LIV      Birth Comments: c/s due to preeclampsia and fetal decels      Complications: Preeclampsia           Pre-eclampsia:  No      C-Section:  Yes x 2      D & C:  No      Cerclage:  No      LEEP:  No      Myomectomy:  No      Preterm Labor: No    Past Medical History:  Denies asthma, cardiac or thyroid disease  Diagnosis Date    Gestational diabetes mellitus     Mental disorder     anxiety    Pregnancy-induced hypertension in third trimester         Past Surgical History:    Procedure Laterality Date    CESAREAN SECTION, LOW TRANSVERSE      OTHER SURGICAL HISTORY  30yrs old and 30 yrs old    tubes in ears    PR CESAREAN DELIVERY ONLY N/A 08/21/2017    CESAREAN SECTION performed by 10/21/2017, MD at [facility] L&D        Social History:    Reports  that she has been smoking cigarettes. She started smoking about 12 years ago. She has been smoking an average of .25 packs per day. She has never used smokeless tobacco. She reports that she does not drink alcohol and does not use drugs.     No family history on file.    Blood type: O positive  Antibody screen:   Lab Results   Component Value Date    LABANTI NEG 08/21/2017     CBC:   Lab Results   Component Value Date    WBC 8.3 10/28/2021    HGB 13.2 10/28/2021    HCT 39.9 10/28/2021    MCV 88.3 10/28/2021  PLT 332 10/28/2021      Rubella: Immune  RPR: Non-reactive  Hepatitis B Surface Antigen: Non-reactive  HIV: Non-reactive  Gonorrhea: No results found for: GONDNA  Chlamydia: No results found for: LABCHLA  Group B Strep: positive 07/28/2021      Medications Prior to Admission:  Medications Prior to Admission: hydrOXYzine pamoate (VISTARIL) 25 MG capsule, Take 1 capsule by mouth 4 times daily as needed for Itching or Anxiety (will make you drowsy)  methylPREDNISolone (MEDROL, PAK,) 4 MG tablet, Take 1 tablet by mouth daily for 2 days, THEN 0.5 tablets daily for 4 days. Take by mouth.Marland Kitchen  aspirin 81 MG chewable tablet, Take 81 mg by mouth daily  ibuprofen (ADVIL;MOTRIN) 800 MG tablet, Take 1 tablet by mouth every 8 hours as needed for Pain (Patient not taking: No sig reported)  docusate sodium (COLACE, DULCOLAX) 100 MG CAPS, Take 100 mg by mouth 2 times daily (Patient not taking: No sig reported)  simethicone (MYLICON) 80 MG chewable tablet, Take 1 tablet by mouth every 6 hours as needed for Flatulence (Patient not taking: No sig reported)  acetaminophen (TYLENOL) 325 MG tablet, Take 650 mg by mouth as needed for Pain (Patient not taking: No sig reported)  Prenatal Vit-Fe Fumarate-FA (PRENATAL 1+1 PO), Take 1 capsule by mouth daily    Allergies:  Penicillins    ROS:  Const: No fever, chills, night sweats, no recent unexplained weight gain/loss  HEENT: No blurred vision, double vision; no ear problems; no sore  throat, congestion; no running nose.  Resp: No cough, no sputum, no pleuritic chest pain, no sob  Cardio: No chest pain, no exertional dyspnea, no PND, no orthopnea, no palpitation, no leg swelling.   GI: No dysphagia, no reflux; no abdominal pain, no n/v; no c/d. No hematochezia    GU: No dysuria, no frequency, hesitancy; no hematuria  MSK: no joint pain, no myalgia, no change in ROM  Neuro: no focal weakness in extremities, no slurred speech, no double vision, no numbness or tingling in extremities  Endo: no heat/cold intolerance, no polyphagia, polydipsia or polyuria  Hem: no increased bleeding, no bruising, no lymphadenopathy  Skin: no skin changes  Psych: no depressed mood, no suicidal ideation  Pertinent +'s & -'s addressed in HPI    PHYSICAL EXAM:  Serial blood pressures:   112/77  126/77  118/79  120/79  123/80  HR 90, RR 14    General appearance: Comfortable  Lungs:  CTA bilaterally, good excursion  Heart:  Regular Rate & Rhythm, no murmur noted  Abdomen:  Soft, non-tender, gravid  Uterus: soft, nontender  Fetal heart rate:  Baseline Heart Rate 130; variability: moderate;  accelerations: present;  decelerations: absent  Cervix: deferred  Presentation: Cephalic, yesterday at MFM  Contraction frequency:  none  Membranes:  Intact  Back: (-) CVA tenderness.  Extremities: trace BLE edema      ASSESSMENT   30 yo female G4 P2012 IUP at 62 weeks' 0 days' gestation    Here for elevated BP         Plan: Orders from Dr. Roseanne Reno:  [facility]  Outpatient  [facility] labs with coag studies      Electronically signed by Kathie Rhodes, PA-C on 10/28/2021 at 1:41 PM     15:11  I reviewed [facility] labs with Dr. Leda Gauze. Due to elevated LFT's and history of pre-eclampsia in 2 previous pregnancies, Dr. Roseanne Reno recommends delivery. Will discuss with Dr. Jory Sims [facility]). 2 previous Cesarean sections.  Patient last ate at 03:00 am. Had Tylenol with a sip of water ~ 50 minutes ago.    Kathie Rhodes, PA-C

## 2021-10-28 NOTE — Progress Notes (Signed)
Called [place] for prenatals.

## 2021-10-29 LAB — CBC
Hematocrit: 33.8 % — ABNORMAL LOW (ref 34.0–48.0)
Hemoglobin: 10.7 g/dL — ABNORMAL LOW (ref 11.5–15.5)
MCH: 29.2 pg (ref 26.0–35.0)
MCHC: 31.7 % — ABNORMAL LOW (ref 32.0–34.5)
MCV: 92.3 fL (ref 80.0–99.9)
MPV: 10.8 fL (ref 7.0–12.0)
Platelets: 254 E9/L (ref 130–450)
RBC: 3.66 E12/L (ref 3.50–5.50)
RDW: 13.8 fL (ref 11.5–15.0)
WBC: 9.6 E9/L (ref 4.5–11.5)

## 2021-10-29 MED ORDER — MEPERIDINE HCL 25 MG/ML IJ SOLN
25 MG/ML | INTRAMUSCULAR | Status: DC | PRN
Start: 2021-10-29 — End: 2021-10-28

## 2021-10-29 MED ORDER — LACTATED RINGERS IV SOLN
INTRAVENOUS | Status: AC
Start: 2021-10-29 — End: 2021-10-30

## 2021-10-29 MED ORDER — OXYCODONE HCL 5 MG PO TABS
5 | ORAL | Status: DC | PRN
Start: 2021-10-29 — End: 2021-10-30

## 2021-10-29 MED ORDER — ONDANSETRON HCL 4 MG/2ML IJ SOLN
42 MG/2ML | Freq: Four times a day (QID) | INTRAMUSCULAR | Status: AC | PRN
Start: 2021-10-29 — End: 2021-10-30

## 2021-10-29 MED ORDER — KETOROLAC TROMETHAMINE 30 MG/ML IJ SOLN
30 MG/ML | Freq: Four times a day (QID) | INTRAMUSCULAR | Status: AC | PRN
Start: 2021-10-29 — End: 2021-10-29
  Administered 2021-10-29 (×3): 30 mg via INTRAVENOUS

## 2021-10-29 MED ORDER — SODIUM CHLORIDE (PF) 0.9 % IJ SOLN
0.9 % | INTRAMUSCULAR | Status: AC | PRN
Start: 2021-10-29 — End: 2021-10-29

## 2021-10-29 MED ORDER — NORMAL SALINE FLUSH 0.9 % IV SOLN
0.9 % | Freq: Two times a day (BID) | INTRAVENOUS | Status: AC
Start: 2021-10-29 — End: 2021-10-30

## 2021-10-29 MED ORDER — DIPHENHYDRAMINE HCL 25 MG PO TABS
25 MG | Freq: Four times a day (QID) | ORAL | Status: AC | PRN
Start: 2021-10-29 — End: 2021-10-29

## 2021-10-29 MED ORDER — ACETAMINOPHEN 325 MG PO TABS
325 MG | Freq: Four times a day (QID) | ORAL | Status: AC
Start: 2021-10-29 — End: 2021-10-29
  Administered 2021-10-29 (×2): 650 mg via ORAL

## 2021-10-29 MED ORDER — MORPHINE SULFATE (PF) 2 MG/ML IV SOLN
2 MG/ML | INTRAVENOUS | Status: DC | PRN
Start: 2021-10-29 — End: 2021-10-28

## 2021-10-29 MED ORDER — ONDANSETRON 4 MG PO TBDP
4 MG | Freq: Three times a day (TID) | ORAL | Status: DC | PRN
Start: 2021-10-29 — End: 2021-10-30

## 2021-10-29 MED ORDER — DIPHENHYDRAMINE HCL 50 MG/ML IJ SOLN
50 MG/ML | Freq: Once | INTRAMUSCULAR | Status: DC | PRN
Start: 2021-10-29 — End: 2021-10-28

## 2021-10-29 MED ORDER — NORMAL SALINE FLUSH 0.9 % IV SOLN
0.9 % | INTRAVENOUS | Status: DC | PRN
Start: 2021-10-29 — End: 2021-10-30
  Administered 2021-10-29: 17:00:00 10 mL via INTRAVENOUS

## 2021-10-29 MED ORDER — LANSINOH LANOLIN EX CREA
CUTANEOUS | Status: DC | PRN
Start: 2021-10-29 — End: 2021-10-30

## 2021-10-29 MED ORDER — ONDANSETRON HCL 4 MG/2ML IJ SOLN
42 MG/2ML | Freq: Four times a day (QID) | INTRAMUSCULAR | Status: DC | PRN
Start: 2021-10-29 — End: 2021-10-30

## 2021-10-29 MED ORDER — PROCHLORPERAZINE EDISYLATE 10 MG/2ML IJ SOLN
10 MG/2ML | Freq: Once | INTRAMUSCULAR | Status: DC | PRN
Start: 2021-10-29 — End: 2021-10-28

## 2021-10-29 MED ORDER — OXYCODONE HCL 5 MG PO TABS
5 MG | ORAL | Status: DC | PRN
Start: 2021-10-29 — End: 2021-10-30
  Administered 2021-10-30: 02:00:00 10 mg via ORAL

## 2021-10-29 MED ORDER — TETANUS-DIPHTH-ACELL PERTUSSIS 5-2.5-18.5 LF-MCG/0.5 IM SUSY
INTRAMUSCULAR | Status: DC
Start: 2021-10-29 — End: 2021-10-30

## 2021-10-29 MED ORDER — OXYCODONE HCL 5 MG PO TABS
5 MG | ORAL | Status: AC | PRN
Start: 2021-10-29 — End: 2021-10-29
  Administered 2021-10-29 (×2): 5 mg via ORAL

## 2021-10-29 MED ORDER — DOCUSATE SODIUM 100 MG PO CAPS
100 MG | Freq: Two times a day (BID) | ORAL | Status: AC
Start: 2021-10-29 — End: 2021-10-30
  Administered 2021-10-29 – 2021-10-30 (×3): 100 mg via ORAL

## 2021-10-29 MED ORDER — SODIUM CHLORIDE 0.9 % IV SOLN
0.9 % | INTRAVENOUS | Status: DC | PRN
Start: 2021-10-29 — End: 2021-10-30

## 2021-10-29 MED ORDER — FENTANYL CITRATE (PF) 100 MCG/2ML IJ SOLN
100 MCG/2ML | INTRAMUSCULAR | Status: DC | PRN
Start: 2021-10-29 — End: 2021-10-28

## 2021-10-29 MED ORDER — IBUPROFEN 800 MG PO TABS
800 MG | Freq: Three times a day (TID) | ORAL | Status: AC | PRN
Start: 2021-10-29 — End: 2021-10-30
  Administered 2021-10-30 (×2): 800 mg via ORAL

## 2021-10-29 MED ORDER — DIPHENHYDRAMINE HCL 50 MG/ML IJ SOLN
50 MG/ML | INTRAMUSCULAR | Status: AC | PRN
Start: 2021-10-29 — End: 2021-10-29

## 2021-10-29 MED ORDER — FERROUS SULFATE 325 (65 FE) MG PO TABS
325 (65 Fe) MG | Freq: Two times a day (BID) | ORAL | Status: DC
Start: 2021-10-29 — End: 2021-10-30

## 2021-10-29 MED ORDER — MEASLES, MUMPS & RUBELLA VAC IJ SOLR
INTRAMUSCULAR | Status: AC
Start: 2021-10-29 — End: 2021-10-30

## 2021-10-29 MED ORDER — NORMAL SALINE FLUSH 0.9 % IV SOLN
0.9 % | INTRAVENOUS | Status: DC | PRN
Start: 2021-10-29 — End: 2021-10-28

## 2021-10-29 MED ORDER — NORMAL SALINE FLUSH 0.9 % IV SOLN
0.9 % | Freq: Two times a day (BID) | INTRAVENOUS | Status: DC
Start: 2021-10-29 — End: 2021-10-28

## 2021-10-29 MED ORDER — SODIUM CHLORIDE 0.9 % IV SOLN
0.9 % | INTRAVENOUS | Status: DC | PRN
Start: 2021-10-29 — End: 2021-10-28

## 2021-10-29 MED ORDER — ACETAMINOPHEN 500 MG PO TABS
500 MG | Freq: Three times a day (TID) | ORAL | Status: AC | PRN
Start: 2021-10-29 — End: 2021-10-30
  Administered 2021-10-30 (×2): 1000 mg via ORAL

## 2021-10-29 MED FILL — KETOROLAC TROMETHAMINE 30 MG/ML IJ SOLN: 30 MG/ML | INTRAMUSCULAR | Qty: 1

## 2021-10-29 MED FILL — OXYCODONE HCL 5 MG PO TABS: 5 MG | ORAL | Qty: 1

## 2021-10-29 MED FILL — BD POSIFLUSH 0.9 % IV SOLN: 0.9 % | INTRAVENOUS | Qty: 10

## 2021-10-29 MED FILL — BD POSIFLUSH 0.9 % IV SOLN: 0.9 % | INTRAVENOUS | Qty: 20

## 2021-10-29 MED FILL — MAPAP 325 MG PO TABS: 325 MG | ORAL | Qty: 2

## 2021-10-29 MED FILL — DOCUSATE SODIUM 100 MG PO CAPS: 100 MG | ORAL | Qty: 1

## 2021-10-29 NOTE — Progress Notes (Signed)
Pt. Dangled at the bedside and was able to ambulate to the bathroom. Peri care done with minimal assistance. Pt. Resting comfortably in bed.

## 2021-10-29 NOTE — Progress Notes (Signed)
Pt up to shower.  Encouraged to sit on shower seat.

## 2021-10-29 NOTE — Lactation Note (Signed)
Went back to assist pt with breastfeeding-baby in nursery.  Will call lactation office for help w/  next feeding.

## 2021-10-29 NOTE — Progress Notes (Addendum)
Pt. Wanted to attempt to get out of bed at this time. Pt. Foley removed per protocol. Pt. Dangled at the bedside and stated was in to much pain. Advised to wait till 330 for pain medication and we will attempt to get up after the medication.

## 2021-10-29 NOTE — Progress Notes (Signed)
Hearing screening results were discussed with parent. Questions answered. Brochure given to parent. Advised to monitor developmental milestones and contact physician for any concerns.   Electronically signed by Molli Knock, AuD on 10/29/2021 at 9:42 AM

## 2021-10-29 NOTE — Anesthesia Post-Procedure Evaluation (Signed)
Department of Anesthesiology  Postprocedure Note    Patient: Willetta York  MRN: [id number]  Birthdate: March 27, 1991  Date of evaluation: 10/29/2021      Procedure Summary     Date: 10/28/21 Room / Location: [facility] L&D OR 01 / [facility]    Anesthesia Start: 1816 Anesthesia Stop: 1926    Procedure: CESAREAN SECTION (Uterus) Diagnosis:       Preterm newborn infant of 27 completed weeks of gestation      (Preterm newborn infant of 48 completed weeks of gestation [P07.39])    Surgeons: Minda Meo, MD Responsible Provider: Jeni Salles., MD    Anesthesia Type: Spinal ASA Status: 3          Anesthesia Type: Spinal    Aldrete Phase I: Aldrete Score: 10    Aldrete Phase II:        Anesthesia Post Evaluation    Patient location during evaluation: bedside  Patient participation: complete - patient participated  Level of consciousness: awake and alert  Pain score: 0  Airway patency: patent  Nausea & Vomiting: no nausea and no vomiting  Complications: no  Cardiovascular status: hemodynamically stable  Respiratory status: room air  Hydration status: euvolemic

## 2021-10-29 NOTE — Lactation Note (Signed)
Pt reports breastfeeding difficulties-baby sleepy and disinterested in breast.  Requests assistance at next feeding.  Instructed on normal infant behavior in the first 12-24 hrs, benefits of skin to skin and components of safe positioning, encouraged rooming-in and avoidance of pacifier use until breastfeeding is well established.  Reviewed latch techniques, positioning, signs of effective milk transfer, waking techniques and the importance of frequent feedings- 8-12 times/ 24 hrs to stimulate/maintain milk production.  Knows hand expression and encouraged to express drops of colostrum at start of feeding.  Reviewed feeding cues and expected urine/stool output and transition.  Encouraged to feed infant as often and for as long as the infant wishes to do so. Referred to [facility] Offered support and encouraged to call for assistance or concerns. Has electric breast pump at home.

## 2021-10-29 NOTE — Progress Notes (Signed)
Pt up to br-voided large amt urine.  Encouraged to walk around in room and hallways.  Passing gas and tolerating meals well.

## 2021-10-29 NOTE — Op Note (Signed)
[facility]                  [address] [place], [place] [address]                                OPERATIVE REPORT    PATIENT NAME: Tara Mccormick, Tara Mccormick                      DOB:        1991-05-24  MED REC NO:   [id number]                            ROOM:       0317  ACCOUNT NO:   [id number]                           ADMIT DATE: 10/28/2021  PROVIDER:     Franchot Erichsen, MD    DATE OF PROCEDURE:  10/28/2021    PREOPERATIVE DIAGNOSES:  1.  Intrauterine pregnancy at 37 weeks.  2.  Two prior C-sections.  3.  Preeclampsia.    POSTOPERATIVE DIAGNOSES:  1.  Intrauterine pregnancy at 37 weeks.  2.  Two prior C-sections.  3.  Preeclampsia.    PROCEDURE PERFORMED:  Repeat low transverse cesarean section.    SURGEON:  Franchot Erichsen, MD.    ANESTHESIA:  Spinal.    FINDINGS:  Liveborn female infant in cephalic presentation, Apgars 8 and  8, weight 2700 gm.  Tubes, uterus, and ovaries normal.    TOTAL IV FLUIDS:  2000 mL.    URINE OUTPUT:  250 mL, clear.    ESTIMATED BLOOD LOSS:  750 mL.    SPECIMENS:  Cord blood and gases.    COMPLICATIONS:  None.    CONDITION:  Infant stable, transferred to [facility].  Mother  stable, transferred to Postanesthesia Recovery.    HISTORY:  The patient is a 30 year old gravida 4, para 2, at 37 weeks.   The patient had been seen by Maternal-Fetal Medicine.  The patient  presented to Labor and Delivery with a headache.  The patient's blood  pressure was okay, but her liver functions were elevated.  Dr. Roseanne Reno  who has been seeing the patient was called and he recommended delivery.   I spoke to the patient about a repeat cesarean section, risks, benefits,  and she agreed to proceed.    DESCRIPTION OF PROCEDURE:  The patient was brought to the operative  suite.  She was placed under spinal anesthesia.  She was placed in the  dorsal supine position.  She was prepped and draped in the usual sterile  fashion.  Adequate anesthesia was confirmed with  an Allis test.  A  Pfannenstiel skin incision was made with a scalpel and carried down to  the rectus fascia, which was cut in a crescentic fashion.  Fascia was  separated from the muscle in cephalad and caudad direction.  Peritoneum  was entered without difficulty.  Peritoneum was cut in a cephalad and  caudad direction.  Alexis retractor was placed.  A low transverse  incision was made with a scalpel.  I entered bluntly into the uterine  cavity.  Clear fluid.  Incision was extended bluntly.  Baby was in a  vertex presentation, was delivered without difficulty.  Baby  was  suctioned with a bulb.  Delayed cord clamping.  Cord was clamped and  cut.  Infant handed off to the nurses.  Cord gases and cord blood were  obtained.  Placenta was expressed.  Uterus was exteriorized.  Normal  adnexa.  Uterine cavity was wiped off any blood.  There were no retained  membranes or placenta.  Uterine incision was closed with one layer of  1-0 chromic.  I used two sutures, each going halfway and tied to itself.  I cleaned the gutters, cleaned the posterior cul-de-sac, irrigated the  pelvis, again inspected uterine incision and was hemostatic.  Alexis  retractor was then removed.  The uterine incision was again inspected  and was hemostatic.  Muscle was found to be hemostatic.  Fascia was  closed with 1-0 Vicryl x1 in a running fashion.  Subcutaneous tissue was  irrigated and was hemostatic, 3-0 plain x3 interrupteds were placed and  the skin was closed with 4-0 barbed suture.  At the end of the  procedure, all instruments, sponge counts, and needle counts were  correct.  Uterus was firm below the umbilicus and the patient went to  the recovery room in stable condition.        Franchot Erichsen, MD    D: 10/28/2021 19:24:45       T: 10/29/2021 2:16:35     [id number]  Job#: [id number]     Doc#: [id number]    CC:

## 2021-10-29 NOTE — Progress Notes (Signed)
SUBJECTIVE:  Patient without complaint. Voiding without difficulty. Passing flatus. Tolerating regular diet. Normal lochia, denies passing clots. Ambulating in hallway. Breast and bottle feeding for now.    OBJECTIVE:  BP (!) 116/57    Pulse 76    Temp 97.6 ??F (36.4 ??C) (Oral)    Resp 16    LMP 02/11/2021    SpO2 98%   Recent Labs     10/29/21  0539 10/28/21  1245   WBC 9.6 8.3   HGB 10.7* 13.2   HCT 33.8* 39.9   MCV 92.3 88.3   PLT 254 332     Physical Exam:   General: Comfortable  Coronary: RRR  Pulmonary: CTA b/l  Abdomen: soft, appropriately tender. (+) BS x 4 quadrants.   Incision: clean, dry, intact   Uterus firm, nontender   Lochia normal rubra  Extremities: (-) edema, (-) calf tenderness.        ASSESSMENT/PLAN: 30 yo female G4 P3013 [redacted]w[redacted]d s/p repeat LTCS secondary to pre-eclampsia, 2 previous Cesarean sections.  Postoperative Day #1  Advance care  Anticipate discharge tomorrow       Kathie Rhodes, PA-C 10/29/2021 9:07 AM

## 2021-10-30 LAB — COMPREHENSIVE METABOLIC PANEL
ALT: 65 U/L — ABNORMAL HIGH (ref 0–32)
AST: 29 U/L (ref 0–31)
Albumin: 3 g/dL — ABNORMAL LOW (ref 3.5–5.2)
Alkaline Phosphatase: 199 U/L — ABNORMAL HIGH (ref 35–104)
Anion Gap: 11 mmol/L (ref 7–16)
BUN: 6 mg/dL (ref 6–20)
CO2: 22 mmol/L (ref 22–29)
Calcium: 9.1 mg/dL (ref 8.6–10.2)
Chloride: 102 mmol/L (ref 98–107)
Creatinine: 0.5 mg/dL (ref 0.5–1.0)
Est, Glom Filt Rate: >60 mL/min/{1.73_m2} (ref >=60)
Glucose: 100 mg/dL — ABNORMAL HIGH (ref 74–99)
Potassium: 3.8 mmol/L (ref 3.5–5.0)
Sodium: 135 mmol/L (ref 132–146)
Total Bilirubin: 0.4 mg/dL (ref 0.0–1.2)
Total Protein: 5.7 g/dL — ABNORMAL LOW (ref 6.4–8.3)

## 2021-10-30 LAB — CBC
Hematocrit: 30.6 % — ABNORMAL LOW (ref 34.0–48.0)
Hemoglobin: 10.2 g/dL — ABNORMAL LOW (ref 11.5–15.5)
MCH: 29.8 pg (ref 26.0–35.0)
MCHC: 33.3 % (ref 32.0–34.5)
MCV: 89.5 fL (ref 80.0–99.9)
MPV: 10.3 fL (ref 7.0–12.0)
Platelets: 253 E9/L (ref 130–450)
RBC: 3.42 E12/L — ABNORMAL LOW (ref 3.50–5.50)
RDW: 14 fL (ref 11.5–15.0)
WBC: 9.9 E9/L (ref 4.5–11.5)

## 2021-10-30 MED ORDER — OXYCODONE HCL 5 MG PO TABS
5 MG | ORAL_TABLET | Freq: Four times a day (QID) | ORAL | 0 refills | Status: AC | PRN
Start: 2021-10-30 — End: 2021-11-04

## 2021-10-30 MED ORDER — IBUPROFEN 800 MG PO TABS
800 MG | ORAL_TABLET | Freq: Three times a day (TID) | ORAL | 0 refills | Status: AC | PRN
Start: 2021-10-30 — End: ?

## 2021-10-30 MED FILL — OXYCODONE HCL 5 MG PO TABS: 5 MG | ORAL | Qty: 2

## 2021-10-30 MED FILL — IBUPROFEN 800 MG PO TABS: 800 MG | ORAL | Qty: 1

## 2021-10-30 MED FILL — DOCUSATE SODIUM 100 MG PO CAPS: 100 MG | ORAL | Qty: 1

## 2021-10-30 MED FILL — ACETAMINOPHEN EXTRA STRENGTH 500 MG PO TABS: 500 MG | ORAL | Qty: 2

## 2021-10-30 MED FILL — BD POSIFLUSH 0.9 % IV SOLN: 0.9 % | INTRAVENOUS | Qty: 10

## 2021-10-30 NOTE — Discharge Summary (Signed)
[facility]                  [address] [place], [place] [address]                               DISCHARGE SUMMARY    PATIENT NAME: Tara Mccormick, Tara Mccormick                      DOB:        1991-08-11  MED REC NO:   [id number]                            ROOM:       0317  ACCOUNT NO:   [id number]                           ADMIT DATE: 10/28/2021  PROVIDER:     Franchot Erichsen, MD                  [facility] DATE:  10/30/2021    BRIEF HISTORY AND HOSPITAL COURSE:  The patient was admitted on  10/28/2021.  The patient is a 30 year old gravida 4, para 2, at 37  weeks.  The patient presented for [facility] workup.  The patient had a  headache.  The patient has a history of gestational diabetes, diet  controlled.  The patient had preeclampsia in her two prior pregnancies.   The patient has had two prior C-sections.  Blood pressure was okay, but  liver functions were elevated.  Dr. Leda Gauze, the Maternal-Fetal  Medicine specialist recommended delivery.  The patient agreed to  proceed.  The patient underwent a repeat low transverse cesarean section  on 10/28/2021.  Infant weighed 2700 gm with Apgars 8 and 8.  No  complications.  On postoperative day #2, the patient wanted to go home.   Blood pressure was okay.  Labs were repeated.  Platelets were normal.   The patient's ALT was 65.  It was 94 two days earlier.  The patient's  AST was normal.  The patient had no PIH complaints.  Uterus was firm and  nontender.  Incision was clean, dry, and intact.  There was no calf  pain.  Hemoglobin was 10.7.  The patient was discharged home with  precautions, instructed to follow up on 11/01/2021, to see her OB-GYN  for a blood pressure check.        Franchot Erichsen, MD    D: 11/01/2021 10:12:19       T: 11/01/2021 10:37:53     [id number]  Job#: [id number]     Doc#: [id number]    CC:

## 2021-10-30 NOTE — Progress Notes (Signed)
Subjective:    Patient without complaints.  Normal lochia.  Tolerating PO. No headaches or visual changes.  Bowel movements: No  Flatus: Yes  Ambulating: Yes    Objective:  BP (!) 142/69    Pulse 84    Temp 98.6 ??F (37 ??C) (Oral)    Resp 18    LMP 02/11/2021    SpO2 97%    Breastfeeding Unknown   Lungs:  CTA   Cardiac:  Regular rhythm  Abdomen:  Uterus firm, non-tender, incision C/D/I, +bs  Extremities:  No calf pain    Lab Results   Component Value Date    HGB 10.7 (L) 10/29/2021        Assessment:  Post-operative day # 2  Repeat c/s at 37 wks  preeclampsia    Plan: cbc,cmp,monitor bp

## 2021-10-30 NOTE — Progress Notes (Signed)
Wants to go home.  Bp=133/67  Has no pih complaints  Ast normal  Alt 65. Which is decreasing  Platelets okay  Discharge home. Call if pain, fever, emesis, calf pain, shortness of breath, heavy bleeding, incisional problems such as redness, drainage, or seperation , breast pain or redness . Nothing in vagina and no heavy lifting to see ob/gyn on 11/01/21 for bp check. To call if headaches or visual changes.

## 2021-10-30 NOTE — Progress Notes (Signed)
Pt states ready for discharge; discharge instructions given to pt w/ understanding verbalized; prescriptions for motrin and oxycodone handed to pt;pt denies any questions. Pt discharged in apparently stable condition to home w/ baby.

## 2021-10-30 NOTE — Progress Notes (Signed)
Pt resting in bed bonding w;/baby; states taking general diet w/o nausea, passing gas and pain meds effective; denies BM yet and denies any assoc discomfort; inst on dietary measures to assist w/ BM; pt states feeling good and wants to go home today.

## 2021-10-30 NOTE — Lactation Note (Signed)
Mom mostly formula feeding, may still try to breastfeed at home or pump . Encouraged frequent feeds or pumping sessions  to establish milk supply. Reviewed benefits and safety of skin to skin. Inst on adequate I/O and importance of keeping track of diapers at home. Instructed on signs of dehydration such as infant refusing to feed, decreased wet diapers and infant becoming listless and notify provider if these occur. Reviewed with mom the importance of notifying the physician if baby looks more jaundiced. Lactation office # given if follow-up needed, as well as other helpful resources. Encouraged to call with any concerns. Support and encouragement given.

## 2021-10-30 NOTE — Discharge Instructions (Signed)
Follow-up with your OB doctor in 1 week  c-section delivery unless otherwise instructed.   Call office for an appointment.      DIET  Eat a well balanced diet focusing on foods high in fiber and protein  Drink plenty of fluids especially water.  To avoid constipation you may take a mild stool softener as recommended by your doctor or midwife.    ACTIVITY  Gradually increase your activity.  Resume exercise regimen only after advised by your doctor or midwife.  Avoid lifting anything heavier than your baby or a gallon of milk for SIX weeks.   Avoid driving until your doctor or midwife has given their approval.  Rise slowly from a lying to sitting and then a standing position.  Climb stairs one at a time.  Use caution when carrying your baby up and down the stairs.  No sexual activity for 6 weeks or until advised by your doctor - Nothing in vagina: intercourse, tampons, or douching.   Be prepared to discuss family planning at your follow-up OB visit.   You may feel tired or have a lack of energy.  You may continue your prenatal vitamin to replenish nutrients post delivery.  Nap when baby naps to catch up on sleep.  May return to work or school in 6 weeks or as directed by OB.     EMOTIONS  You may feed moody, sad, teary, & overwhelmed.  Contact your OB provider if you feel you may be showing signs of postpartum depression, or have thoughts of harming yourself or your infant.  If infant will not stop crying, contact another adult for help or place infant in their crib on their back and take a break.  NEVER shake your infant.      BLEEDING  Vaginal bleeding will decrease in amount over the next few weeks.  You will notice that as your activity increases, your flow may increase.  This is your body's way of telling you, you need to take things easier and rest more often.  Call your OB/ER if you are saturating more than one maxi pad in an hour.    BREAST CARE                              For non-breastfeeding moms:            If you develop a warm, red, tender area on your breast or develop a fever contact your OB provider.  You may apply ice packs to your breasts over your bra for twenty minutes at a time for comfort.  Avoid stimulation to your breasts, when showering allow the water to strike your back not your breasts.    Wear a good fitting bra until your milk dries, such as a sports bra.    INCISIONAL CARE / PERI CARE    Clean your incision in the shower with mild soap.  After shower pat the incision area dry and leave open to air.  Use the peri-bottle after toileting until bleeding stops.  Cleanse your perineum from front to back  If used, stitches or internal clips will dissolve in 4-6 weeks.  Kegel exercises will help restore bladder control.     SWELLING  Keep your legs elevated when sitting or lying.   When wearing stocking or socks, make sure they are not too tight.    WHEN TO CALL THE DOCTOR  If you have a temp   of 100.4 or more.   If your bleeding has increased and you are saturating a pad in an hour.  Your abdomen is tender to touch.  You are passing blood clots bigger than the size of a lemon.  If you are experiencing extreme weakness or dizziness.   If you are having flu-like symptoms such as achy muscles or joints.  There is a foul smell or a green color to your vaginal bleeding.  If you have pain that cannot be relieved.  You have persistent burning with urination or frequency.   Call if you have concerns about your well-being.  You are unable to sleep, eat, or are having thoughts of harming yourself or your baby.   You have swelling, bleeding, drainage, foul odor, redness, or warmth in/around your incision or stitches.  You have a red, warm, tender area in your calf.     Never Shake a Baby Promise    Shaking can kill a baby.  It can also cause seizures, brain damage, learning problems, cerebral palsy, blindness and other serious health and developmental problems.  I understand that shaking a baby is a serious form of  child abuse.      I Promise Never To Shake My Baby    I understand that caregivers other then the mother often shake babies.  I also promise to discuss the dangers of shaking a baby with everyone who takes care of my baby.  I promise to tell anyone who cares for my baby to never, never shake my baby.

## 2021-11-03 ENCOUNTER — Encounter: Payer: PRIVATE HEALTH INSURANCE | Attending: Maternal & Fetal Medicine

## 2022-01-24 ENCOUNTER — Encounter: Payer: Self-pay | Admitting: Certified Nurse Midwife

## 2022-04-07 ENCOUNTER — Inpatient Hospital Stay (HOSPITAL_COMMUNITY)
Admission: AD | Admit: 2022-04-07 | Payer: Managed Care, Other (non HMO) | Source: Home / Self Care | Admitting: Obstetrics and Gynecology

## 2022-04-07 NOTE — H&P (Signed)
Selena White is a 31 y.o. [id number] at  38'4 presenting for IOL due to T1DM in the setting of new prednisone use and polyhydramnios. Pt notes regular contractions but tolerating well . Good fetal movement, No vaginal bleeding, not leaking fluid. 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 ?[facility] at [facility] since 7 wks ?-Dated by 7 wk u/s, unsure LMP, h/o irreg menses ?- T1DM, starting A1C 6.4, followed by endocrine, on insulin pump and continuous monitor. A1C has stayed in the 6's ?- fetal growth- 36 wks 6'10/ 82% ?- polyhydramnios. 37 wks AFI 30, 38 wks AFI 20. BPP have stayed 8/8 ?- anemia, on iron ?- placental lakes ?- arcuate uterus ?- GBS pos ? ? ?Prenatal Transfer Tool  ?Maternal Diabetes: Yes:  Diabetes Type:  Pre-pregnancy, Insulin/Medication controlled ?Genetic Screening: Normal ?Maternal Ultrasounds/Referrals: Normal ?Fetal Ultrasounds or other Referrals:  Referred to Materal Fetal Medicine  ?Maternal Substance Abuse:  No ?Significant Maternal Medications:  None ?Significant Maternal Lab Results: Group B Strep positive ? ? ? ? ?OB History   ? ? Gravida  ?2  ? Para  ?2  ? Term  ?2  ? Preterm  ?0  ? AB  ?0  ? Living  ?2  ?  ? ? SAB  ?0  ? IAB  ?0  ? Ectopic  ?0  ? Multiple  ?0  ? Live Births  ?2  ?   ?  ?  ? ?Past Medical History:  ?Diagnosis Date  ? Arrhythmia   ? Asthma   ? Hand tendonitis   ? Heavy periods   ? Mixed hypoglycemia   ? Overweight   ? Painful menstrual periods   ? UTI (lower urinary tract infection)   ? ?Past Surgical History:  ?Procedure Laterality Date  ? WISDOM TOOTH EXTRACTION    ? ?Family History: family history includes Cancer in her maternal grandmother; Diabetes in her father and maternal grandmother; Heart disease in her father; Heart failure in her father; Hypertension in her father; Hypothyroidism in her mother. ?Social History:  reports that she has never smoked. She has never used smokeless tobacco. She reports that she does not  drink alcohol and does not use drugs. ? ?Review of Systems - Negative except L sided facial paralyis, regular contractions ? ? ?  ? ?Physical Exam: tearful, anxious, facial paralysis with inability to close L eye ?Gen: well appearing, no distress ?CV: RRR ?Pulm: CTAB ?Back: no CVAT ?Abd: gravid, NT, no RUQ pain ?LE: no  edema, equal bilaterally, non-tender ?Toco: irritibility, ctx q 5 ?FH: baseline 120s, accelerations present, no deceleratons, 10 beat variability ? ?Prenatal labs: ?ABO, Rh:  O+ ?Antibody:  neg ?Rubella:  immune ?RPR:   NR ?HBsAg:   neg ?HIV:   neg ?GBS:   pos ?1 hr Glucola not done  ?Genetic screening nl Panorama ?Anatomy US normal ? ? ?Assessment/Plan: 31 y.o. [id number] at 38'4 ?- IOL. Plan pit 2x2, caution with AROM given polyhydramnios ?-T1DM, Pt with excellent understanding of her disease. Will consult diabetic coordinator for control during labor with onset of prednisone ?- Bells palsy, continue prednisone which was started last night. Add Valtrex 1000mg  tid, eye protection with drops q1hr and ointment with sleep ?- GBS pos. PCN on admit ?- reactive fetal testing ? ? ?04/07/2022 2:08 PM  ?  ? ?04/09/2022 ?04/07/2022, 2:01 PM ? ? ?

## 2023-08-07 ENCOUNTER — Other Ambulatory Visit (HOSPITAL_COMMUNITY)
Admission: RE | Admit: 2023-08-07 | Discharge: 2023-08-07 | Disposition: A | Discharge status: Home / Self Care | Payer: Managed Care, Other (non HMO) | Source: Ambulatory Visit | Attending: Certified Nurse Midwife | Admitting: Certified Nurse Midwife

## 2023-08-07 ENCOUNTER — Ambulatory Visit (INDEPENDENT_AMBULATORY_CARE_PROVIDER_SITE_OTHER): Payer: Managed Care, Other (non HMO) | Admitting: Certified Nurse Midwife

## 2023-08-07 ENCOUNTER — Encounter: Payer: Self-pay | Admitting: Certified Nurse Midwife

## 2023-08-07 ENCOUNTER — Other Ambulatory Visit: Payer: Self-pay | Admitting: Certified Nurse Midwife

## 2023-08-07 VITALS — BP 109/77 | HR 76 | Ht 61.0 in | Wt 149.2 lb

## 2023-08-07 DIAGNOSIS — Z1151 Encounter for screening for human papillomavirus (HPV): Secondary | ICD-10-CM

## 2023-08-07 DIAGNOSIS — Z124 Encounter for screening for malignant neoplasm of cervix: Secondary | ICD-10-CM | POA: Insufficient documentation

## 2023-08-07 DIAGNOSIS — R928 Other abnormal and inconclusive findings on diagnostic imaging of breast: Secondary | ICD-10-CM

## 2023-08-07 DIAGNOSIS — N6311 Unspecified lump in the right breast, upper outer quadrant: Secondary | ICD-10-CM

## 2023-08-07 DIAGNOSIS — Z01419 Encounter for gynecological examination (general) (routine) without abnormal findings: Secondary | ICD-10-CM

## 2023-08-07 DIAGNOSIS — N946 Dysmenorrhea, unspecified: Secondary | ICD-10-CM

## 2023-08-07 DIAGNOSIS — Z0000 Encounter for general adult medical examination without abnormal findings: Secondary | ICD-10-CM

## 2023-08-07 MED ORDER — IBUPROFEN 800 MG PO TABS: 800.0000 mg | ORAL_TABLET | Freq: Three times a day (TID) | ORAL | 1 refills | Status: AC | PRN

## 2023-08-07 NOTE — Patient Instructions (Signed)

## 2023-08-07 NOTE — Progress Notes (Signed)
ANNUAL EXAM Patient name: Selena White MRN [id number]  Date of birth: [birth year] Chief Complaint:   Annual Exam  History of Present Illness:   [name] is a 32 y.o. G22P2002 Caucasian female being seen today for a routine annual exam.  Current complaints: right breast soreness, in area where MOHS procedure completed ~1y ago. Pain comes and goes, unsure if related to cycle or not.   Patient's last menstrual period was 07/27/2023 (exact date).   Upstream - 08/07/23 1133       Contraception Wrap Up   End Method Female Condom    Contraception Counseling Provided Yes    How was the end contraceptive method provided? N/A            The pregnancy intention screening data noted above was reviewed. Potential methods of contraception were discussed. The patient elected to proceed with Female Condom.      Component Value Date/Time   DIAGPAP  04/02/2020 1402    - Negative for intraepithelial lesion or malignancy (NILM)   ADEQPAP  04/02/2020 1402    Satisfactory for evaluation; transformation zone component PRESENT.      Last mammogram: n/a. Results were: N/A. Family h/o breast cancer: no Last colonoscopy: n/a. Results were: N/A. Family h/o colorectal cancer: no     08/07/2023    4:43 PM 03/18/2021    3:07 PM 02/12/2021   11:56 AM 01/22/2021    9:41 AM 12/15/2017    9:45 AM  Depression screen PHQ 2/9  Decreased Interest 0 0 0 0 0  Down, Depressed, Hopeless 0 0 0 0 0  PHQ - 2 Score 0 0 0 0 0  Altered sleeping  0 0  0  Tired, decreased energy  1 1  0  Change in appetite  0 0  0  Feeling bad or failure about yourself   0 0  0  Trouble concentrating  0 0  0  Moving slowly or fidgety/restless  0 0  0  Suicidal thoughts  0 0  0  PHQ-9 Score  1 1  0  Difficult doing work/chores  Not difficult at all Not difficult at all          03/18/2021    3:08 PM 02/12/2021   11:59 AM  GAD 7 : Generalized Anxiety Score  Nervous, Anxious, on Edge 0 0  Control/stop worrying 0 0  Worry too  much - different things 0 0  Trouble relaxing 0 0  Restless 0 0  Easily annoyed or irritable 0 0  Afraid - awful might happen 0 0  Total GAD 7 Score 0 0     Review of Systems:   Pertinent items are noted in HPI Denies any headaches, blurred vision, fatigue, shortness of breath, chest pain, abdominal pain, abnormal vaginal discharge/itching/odor/irritation, problems with periods, bowel movements, urination, or intercourse unless otherwise stated above. Pertinent History Reviewed:  Reviewed past medical,surgical, social and family history.  Reviewed problem list, medications and allergies. Physical Assessment:   Vitals:   08/07/23 1347  BP: 109/77  Pulse: 76  Weight: 149 lb 3.2 oz (67.7 kg)  Height: 5\' 1"  (1.549 m)  Body mass index is 28.19 kg/m.       Physical Exam Vitals reviewed.  Constitutional:      Appearance: Normal appearance.  HENT:     Head: Normocephalic.  Neck:     Thyroid: No thyroid mass or thyromegaly.  Cardiovascular:     Rate and Rhythm: Normal rate and  regular rhythm.     Heart sounds: Normal heart sounds.  Pulmonary:     Effort: Pulmonary effort is normal.     Breath sounds: Normal breath sounds.  Chest:  Breasts:    Tanner Score is 5.     Right: Mass present.     Left: Normal.       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 Mobile, nontender mass felt superior to scar, unsure if keloid tissue vs discrete mass. Abdominal:     General: Abdomen is flat.     Palpations: Abdomen is soft.     Tenderness: There is no abdominal tenderness.  Genitourinary:    General: Normal vulva.     Vagina: Normal.     Cervix: Normal.  Musculoskeletal:     Cervical back: Neck supple. No tenderness.  Lymphadenopathy:     Upper Body:     Right upper body: No axillary adenopathy.     Left upper body: No axillary adenopathy.  Skin:    General: Skin is warm and dry.  Neurological:     General: No focal deficit present.     Mental  Status: She is alert and oriented to person, place, and time.  Psychiatric:        Mood and Affect: Mood normal.        Behavior: Behavior normal.      No results found for this or any previous visit (from the past 24 hour(s)).  Assessment & Plan:  1. Annual physical exam - Basic metabolic panel - CBC - Lipid panel - TSH - Hemoglobin A1c  2. Encounter for screening for human papillomavirus (HPV) - Cytology - PAP  3. Cervical cancer screening - Cytology - PAP  4. Mass of upper outer quadrant of right breast - US LIMITED ULTRASOUND INCLUDING AXILLA RIGHT BREAST; Future  5. Dysmenorrhea - Ibuprofen 800mg  po q8h for 1-2d prior to cycle and for 1-2d after start of cycle.   Mammogram: @ 32yo, or sooner if problems Colonoscopy: @ 32yo, or sooner if problems  Orders Placed This Encounter  Procedures   [place] LIMITED ULTRASOUND INCLUDING AXILLA RIGHT BREAST   Basic metabolic panel   CBC   Lipid panel   TSH   Hemoglobin A1c    Meds:  Meds ordered this encounter  Medications   ibuprofen (ADVIL) 800 MG tablet    Sig: Take 1 tablet (800 mg total) by mouth every 8 (eight) hours as needed for moderate pain.    Dispense:  50 tablet    Refill:  1    Order Specific Question:   Supervising Provider    Answer:   Hildred Laser [[id number]]    Follow-up: Return in 1 year (on 08/06/2024).  Dominica Severin, CNM 08/07/2023 4:46 PM

## 2023-08-08 LAB — CBC
Hematocrit: 39.5 % (ref 34.0–46.6)
Hemoglobin: 13.1 g/dL (ref 11.1–15.9)
MCH: 28.9 pg (ref 26.6–33.0)
MCHC: 33.2 g/dL (ref 31.5–35.7)
MCV: 87 fL (ref 79–97)
Platelets: 190 10*3/uL (ref 150–450)
RBC: 4.53 x10E6/uL (ref 3.77–5.28)
RDW: 12.2 % (ref 11.7–15.4)
WBC: 5.8 10*3/uL (ref 3.4–10.8)

## 2023-08-08 LAB — HEMOGLOBIN A1C
Est. average glucose Bld gHb Est-mCnc: 108 mg/dL
Hgb A1c MFr Bld: 5.4 % (ref 4.8–5.6)

## 2023-08-08 LAB — BASIC METABOLIC PANEL
BUN/Creatinine Ratio: 23 (ref 9–23)
BUN: 19 mg/dL (ref 6–20)
CO2: 25 mmol/L (ref 20–29)
Calcium: 9.7 mg/dL (ref 8.7–10.2)
Chloride: 100 mmol/L (ref 96–106)
Creatinine, Ser: 0.84 mg/dL (ref 0.57–1.00)
Glucose: 87 mg/dL (ref 70–99)
Potassium: 4.7 mmol/L (ref 3.5–5.2)
Sodium: 138 mmol/L (ref 134–144)
eGFR: 95 mL/min/{1.73_m2} (ref >59)

## 2023-08-08 LAB — TSH: TSH: 1.61 u[IU]/mL (ref 0.450–4.500)

## 2023-08-09 LAB — CYTOLOGY - PAP
Comment: NEGATIVE
Diagnosis: NEGATIVE
High risk HPV: NEGATIVE

## 2023-08-30 ENCOUNTER — Ambulatory Visit
Admission: RE | Admit: 2023-08-30 | Discharge: 2023-08-30 | Disposition: A | Discharge status: Home / Self Care | Payer: Managed Care, Other (non HMO) | Source: Ambulatory Visit | Attending: Certified Nurse Midwife | Admitting: Certified Nurse Midwife

## 2023-08-30 DIAGNOSIS — N6311 Unspecified lump in the right breast, upper outer quadrant: Secondary | ICD-10-CM | POA: Diagnosis present

## 2023-08-31 ENCOUNTER — Other Ambulatory Visit: Payer: Self-pay | Admitting: Certified Nurse Midwife

## 2023-08-31 DIAGNOSIS — N63 Unspecified lump in unspecified breast: Secondary | ICD-10-CM

## 2023-08-31 DIAGNOSIS — R921 Mammographic calcification found on diagnostic imaging of breast: Secondary | ICD-10-CM

## 2023-08-31 DIAGNOSIS — R928 Other abnormal and inconclusive findings on diagnostic imaging of breast: Secondary | ICD-10-CM

## 2023-09-01 ENCOUNTER — Other Ambulatory Visit: Payer: Self-pay | Admitting: Licensed Practical Nurse

## 2023-09-01 DIAGNOSIS — R928 Other abnormal and inconclusive findings on diagnostic imaging of breast: Secondary | ICD-10-CM

## 2023-09-01 DIAGNOSIS — R921 Mammographic calcification found on diagnostic imaging of breast: Secondary | ICD-10-CM

## 2023-09-01 DIAGNOSIS — N63 Unspecified lump in unspecified breast: Secondary | ICD-10-CM

## 2023-09-07 ENCOUNTER — Ambulatory Visit
Admission: RE | Admit: 2023-09-07 | Discharge: 2023-09-07 | Disposition: A | Discharge status: Home / Self Care | Payer: Managed Care, Other (non HMO) | Source: Ambulatory Visit | Attending: Licensed Practical Nurse | Admitting: Licensed Practical Nurse

## 2023-09-07 DIAGNOSIS — R928 Other abnormal and inconclusive findings on diagnostic imaging of breast: Secondary | ICD-10-CM

## 2023-09-07 DIAGNOSIS — N63 Unspecified lump in unspecified breast: Secondary | ICD-10-CM | POA: Insufficient documentation

## 2023-09-07 DIAGNOSIS — R921 Mammographic calcification found on diagnostic imaging of breast: Secondary | ICD-10-CM

## 2023-09-07 HISTORY — PX: BREAST BIOPSY: SHX20

## 2023-09-07 MED ORDER — LIDOCAINE 1 % OPTIME INJ - NO CHARGE
5.0000 mL | Freq: Once | INTRAMUSCULAR | Status: AC
  Administered 2023-09-07: 5 mL
  Filled 2023-09-07: qty 6

## 2023-09-07 MED ORDER — LIDOCAINE HCL (PF) 1 % IJ SOLN
5.0000 mL | Freq: Once | INTRAMUSCULAR | Status: AC
  Administered 2023-09-07: 5 mL via INTRADERMAL
  Filled 2023-09-07: qty 5

## 2023-09-07 MED ORDER — LIDOCAINE-EPINEPHRINE 1 %-1:100000 IJ SOLN
10.0000 mL | Freq: Once | INTRAMUSCULAR | Status: AC
  Administered 2023-09-07: 10 mL via INTRADERMAL
  Filled 2023-09-07: qty 10

## 2023-09-07 MED ORDER — LIDOCAINE-EPINEPHRINE 1 %-1:100000 IJ SOLN
10.0000 mL | Freq: Once | INTRAMUSCULAR | Status: AC
  Administered 2023-09-07: 10 mL
  Filled 2023-09-07: qty 10

## 2023-09-08 LAB — SURGICAL PATHOLOGY

## 2023-09-11 ENCOUNTER — Encounter: Payer: Self-pay | Admitting: *Deleted

## 2023-09-11 NOTE — Progress Notes (Signed)
Received a message from [facility] with [facility] about benign biopsy results but Ms. Hofferber is concerned about still having pain in her breast and would like surgical consult.   I called and left a VM letting her know that I am not able to initiate that referral without the radiologists recommending it but she can certainly contact her PCP or GYN and ask if they will send a surgical referral.

## 2023-10-17 ENCOUNTER — Encounter: Payer: Self-pay | Admitting: Certified Nurse Midwife

## 2023-10-27 ENCOUNTER — Other Ambulatory Visit: Payer: Self-pay | Admitting: Certified Nurse Midwife

## 2023-10-27 DIAGNOSIS — N6311 Unspecified lump in the right breast, upper outer quadrant: Secondary | ICD-10-CM

## 2023-11-03 ENCOUNTER — Other Ambulatory Visit: Payer: Self-pay | Admitting: Certified Nurse Midwife

## 2023-11-03 DIAGNOSIS — N6311 Unspecified lump in the right breast, upper outer quadrant: Secondary | ICD-10-CM

## 2023-11-07 ENCOUNTER — Telehealth: Payer: Self-pay

## 2023-11-07 ENCOUNTER — Encounter: Payer: Self-pay | Admitting: General Surgery

## 2023-11-07 ENCOUNTER — Ambulatory Visit (INDEPENDENT_AMBULATORY_CARE_PROVIDER_SITE_OTHER): Payer: Managed Care, Other (non HMO) | Admitting: General Surgery

## 2023-11-07 VITALS — BP 119/69 | HR 80 | Temp 99.2°F | Ht 61.0 in | Wt 150.6 lb

## 2023-11-07 DIAGNOSIS — D241 Benign neoplasm of right breast: Secondary | ICD-10-CM

## 2023-11-07 NOTE — Patient Instructions (Signed)
We have spoken today about removing a lump in your breast. This will be done by Dr. Maurine Minister at [facility].  You will most likely be able to leave the hospital several hours after your surgery. Rarely, a patient needs to stay over night but this is a possibility.  Plan to tentatively be off work for 1-2 weeks following the surgery and may return with approximately 2 more weeks of a lifting restriction, no greater than 15 lbs.  Please see your Blue surgery sheet for more information. Our surgery scheduler will call you to look at surgery dates and to go over information.   If you have FMLA or Disability paperwork that needs to be filled out, please have your company fax your paperwork to [phone] or you may drop this by either office. This paperwork will be filled out within 3 days after your surgery has been completed.    Lumpectomy A lumpectomy is a form of "breast conserving" or "breast preservation" surgery. It may also be referred to as a partial mastectomy. During a lumpectomy, the portion of the breast that contains the cancerous tumor or breast mass (the lump) is removed. Some normal tissue around the lump may also be removed to make sure all of the tumor has been removed.  LET [facility] CARE PROVIDER KNOW ABOUT: Any allergies you have. All medicines you are taking, including vitamins, herbs, eye drops, creams, and over-the-counter medicines. Previous problems you or members of your family have had with the use of anesthetics. Any blood disorders you have. Previous surgeries you have had. Medical conditions you have. RISKS AND COMPLICATIONS Generally, this is a safe procedure. However, problems can occur and include: Bleeding. Infection. Pain. Temporary swelling. Change in the shape of the breast, particularly if a large portion is removed. BEFORE THE PROCEDURE Ask your health care provider about changing or stopping your regular medicines. This is especially important if you are  taking diabetes medicines or blood thinners. Do not eat or drink anything after midnight on the night before the procedure or as directed by your health care provider. Ask your health care provider if you can take a sip of water with any approved medicines. On the day of surgery, your health care provider will use a mammogram or ultrasound to locate and mark the tumor in your breast. These markings on your breast will show where the cut (incision) will be made.   PROCEDURE  An IV tube will be put into one of your veins. You may be given medicine to help you relax before the surgery (sedative). You will be given one of the following: A medicine that numbs the area (local anesthetic). A medicine that makes you fall asleep (general anesthetic). Your health care provider will use a kind of electric scalpel that uses heat to minimize bleeding (electrocautery knife). A curved incision (like a smile or frown) that follows the natural curve of your breast is made, to allow for minimal scarring and better healing. The tumor will be removed with some of the surrounding tissue. This will be sent to the lab for analysis. Your health care provider may also remove your lymph nodes at this time if needed. Sometimes, but not always, a rubber tube called a drain will be surgically inserted into your breast area or armpit to collect excess fluid that may accumulate in the space where the tumor was. This drain is connected to a plastic bulb on the outside of your body. This drain creates suction to  help remove the fluid. The incisions will be closed with stitches (sutures). A bandage may be placed over the incisions. AFTER THE PROCEDURE You will be taken to the recovery area. You will be given medicine for pain. A small rubber drain may be placed in the breast for 2-3 days to prevent a collection of blood (hematoma) from developing in the breast. You will be given instructions on caring for the drain before you go  home. A pressure bandage (dressing) will be applied for 1-2 days to prevent bleeding. Ask your health care provider how to care for your bandage at home.   This information is not intended to replace advice given to you by your health care provider. Make sure you discuss any questions you have with your health care provider.   Document Released: 01/09/2007 Document Revised: 12/19/2014 Document Reviewed: 05/03/2013 Elsevier Interactive Patient Education [employer]

## 2023-11-07 NOTE — Telephone Encounter (Signed)
Pt calling; was referred to gen surgeon whom she saw today; they are referring her to Dr. Dellis Anes at [facility].  Pt needs mammograms and biopsy results faxed to them at [phone].  Adv pt will need to come by and fill out a ROI. Called pt back to say if they need the actually films she needs to sign a ROI at the facility that did mammograms.

## 2023-11-15 ENCOUNTER — Encounter: Payer: Self-pay | Admitting: General Surgery

## 2023-11-15 NOTE — Progress Notes (Signed)
Patient ID: Selena White, female   DOB: 29-Oct-1991, 32 y.o.   MRN: [id number] CC: Fibroadenoma of breast  History of Present Illness Selena White is a 32 y.o. female with past medical history as listed below who presents in consultation for fibroadenoma.  The patient reports that in the summer she developed a knot in her right upper breast.  She also said that this was associated with pain.  The pain is sharp and focal.  It is not associated with her menstrual cycle.  She was concerned as she also felt a lump so she went for a mammogram.  On the mammogram there was a mass in the right breast that was suspicious with calcifications so it was recommended that she underwent a core needle biopsy.  She then underwent a biopsy of this and this was consistent with a fibroadenoma.  However, the patient reports that she continues to have breast pain and is concerned about the lump.  She says sometimes she can feel a lump but when she has been at other visits other providers have been able to palpate it.  She denies any overlying skin changes.  She denies any nipple discharge.  She denies any problems on the left breast.  Past Medical History Past Medical History:  Diagnosis Date   Arrhythmia    Asthma    Hand tendonitis    Heavy periods    Mixed hypoglycemia    Overweight    Painful menstrual periods    UTI (lower urinary tract infection)       Past Surgical History:  Procedure Laterality Date   BREAST BIOPSY Right 09/07/2023   stereo bx, calcs, Coil clip-path pending   BREAST BIOPSY Right 09/07/2023   stereo bx, calcs, X clip-path pending   BREAST BIOPSY Right 09/07/2023   MM RT BREAST BX W LOC DEV 1ST LESION IMAGE BX SPEC STEREO GUIDE 09/07/2023 [facility]   BREAST BIOPSY Right 09/07/2023   [place] RT BREAST BX W LOC DEV 1ST LESION IMG BX SPEC US GUIDE 09/07/2023 [facility]   BREAST BIOPSY Right 09/07/2023   MM RT BREAST BX W LOC DEV EA AD LESION IMG BX SPEC STEREO GUIDE 09/07/2023  [facility]   WISDOM TOOTH EXTRACTION      No Known Allergies  Current Outpatient Medications  Medication Sig Dispense Refill   ibuprofen (ADVIL) 800 MG tablet Take 1 tablet (800 mg total) by mouth every 8 (eight) hours as needed for moderate pain. 50 tablet 1   No current facility-administered medications for this visit.    Family History Family History  Problem Relation Age of Onset   Hypothyroidism Mother    Heart disease Father    Diabetes Father    Hypertension Father    Heart failure Father    Cancer Maternal Grandmother        breast (mgm sister who has passed)   Diabetes Maternal Grandmother    Breast cancer Other        Social History Social History   Tobacco Use   Smoking status: Never   Smokeless tobacco: Never  Vaping Use   Vaping status: Never Used  Substance Use Topics   Alcohol use: No    Alcohol/week: 0.0 standard drinks of alcohol   Drug use: No       ROS Full ROS of systems performed and is otherwise negative there than what is stated in the HPI  Physical Exam Blood pressure 119/69, pulse 80, temperature 99.2 F (37.3 C), temperature source Oral,  height 5\' 1"  (1.549 m), weight 150 lb 9.6 oz (68.3 kg), SpO2 98%, currently breastfeeding.  Alert and oriented x 3, normal work of breathing on room air, moving all extremity spontaneously Breast exam performed in the presence of a chaperone.  On the left breast there is no axillary lymphadenopathy nor is there any overlying skin changes or nipple discharge.  There is no dominant masses in the breast.  On the right breast she has no axillary lymphadenopathy.  There is no overlying skin changes or nipple discharge.  I am unable to palpate a discrete mass at the area of concern on the mammogram and ultrasound although she does have pain to palpation in the upper quadrants of her right breast.  Data Reviewed Reviewed her mammography and ultrasound.  At approximately 1230 she has a area of  calcifications and has a biopsy clip that has been placed at this area.  I have also reviewed her pathology and this is consistent with fibroadenoma.  I have personally reviewed the patient's imaging and medical records.    Assessment/Plan    The patient is a 32 year old female who presents with right breast pain and a biopsy-proven fibroadenoma.  I discussed with her that fibroadenomas are benign lesions that are common in women of her age.  I also discussed that if she is having pain at the site then we could perform a excision of the fibroadenoma.  However, I am unable to definitively feel it on exam so I would want her to have a Savi scout placed.  She inquired about other minimally invasive options for resection including  cryoablation or ultrasound guided techniques for excision.  I discussed with her that I do not perform those procedures and if I were to remove it she would need an excisional biopsy.  I also discussed with her that I am happy to refer her to another place that may have more options for minimally invasive treatment of this.  She said that she is going to think about it and will call our office about which option she wants to pursue.     Kandis Cocking 11/15/2023, 9:26 AM

## 2023-11-16 ENCOUNTER — Other Ambulatory Visit: Payer: Self-pay

## 2023-11-16 ENCOUNTER — Telehealth: Payer: Self-pay

## 2023-11-16 DIAGNOSIS — D241 Benign neoplasm of right breast: Secondary | ICD-10-CM

## 2023-11-16 NOTE — Telephone Encounter (Signed)
Faxed referral to Dr. Sherilyn Cooter at [phone].

## 2024-03-05 ENCOUNTER — Encounter: Payer: Self-pay | Admitting: Certified Nurse Midwife

## 2024-03-21 ENCOUNTER — Encounter: Payer: Self-pay | Admitting: Certified Nurse Midwife

## 2024-03-21 ENCOUNTER — Ambulatory Visit (INDEPENDENT_AMBULATORY_CARE_PROVIDER_SITE_OTHER): Admitting: Certified Nurse Midwife

## 2024-03-21 VITALS — BP 114/78 | HR 82 | Ht 61.0 in | Wt 161.6 lb

## 2024-03-21 DIAGNOSIS — E669 Obesity, unspecified: Secondary | ICD-10-CM

## 2024-03-21 DIAGNOSIS — Z713 Dietary counseling and surveillance: Secondary | ICD-10-CM | POA: Diagnosis not present

## 2024-03-21 DIAGNOSIS — Z6830 Body mass index (BMI) 30.0-30.9, adult: Secondary | ICD-10-CM | POA: Diagnosis not present

## 2024-03-21 MED ORDER — CYANOCOBALAMIN 1000 MCG/ML IJ SOLN
1000.0000 ug | Freq: Once | INTRAMUSCULAR | Status: AC
Start: 2024-03-21 — End: 2024-03-21
  Administered 2024-03-21: 1000 ug via INTRAMUSCULAR

## 2024-03-21 MED ORDER — CYANOCOBALAMIN 1000 MCG/ML IJ SOLN: 1000.0000 ug | Freq: Once | INTRAMUSCULAR | 5 refills | Status: AC

## 2024-03-21 MED ORDER — PHENTERMINE HCL 37.5 MG PO TABS: 37.5000 mg | ORAL_TABLET | Freq: Every day | ORAL | 0 refills | Status: DC

## 2024-03-21 NOTE — Progress Notes (Signed)
 Subjective:  Selena White is a 33 y.o. G2P2002 at Unknown being seen today for weight loss management- initial visit.  Patient reports General ROS: negative and reports previous weight loss attempts: has done phentermine in the past and lost weight without negative side effects . She has also use Otavia that she lost weight with but was not able to stick with it long term.   Onset was gradual for the past few years.   Onset followed: pregnancy 3 yrs ago, sedentary life style and poor eating habits.  Associated symptoms include: fatigue, depression, change in clothing fit and menstrual changes.  Pertinent medical history includes: none  Risk factors include: two small children , picky eater   The patient has a surgical history [id number]  Past evaluation has included: basic metabolic profile, hemoglobin A1c, thyroid and lipid screen in 2024.  Past treatment has included: small frequent feedings, nutritional supplement, vitamin supplement, vitamin B-12 injections, appetite stimulant, exercise management.  The following portions of the patient's history were reviewed and updated as appropriate: allergies, current medications, past family history, past medical history, past social history, past surgical history and problem list.   Objective:   Vitals:   03/21/24 0916  BP: 114/78  Pulse: 82  Weight: 161 lb 9.6 oz (73.3 kg)  Height: 5\' 1"  (1.549 m)  Wasit: 37 in  General:  Alert, oriented and cooperative. Patient is in no acute distress.  PE: Well groomed female in no current distress,   Mental Status: Normal mood and affect. Normal behavior. Normal judgment and thought content.   Current BMI: Body mass index is 30.53 kg/m. Her goal lose 20-25 lbs.   Assessment and Plan:  Obesity  1. Obesity (BMI 30-39.9) (Primary)  Plan: low carb, High protein diet RX for adipex 37.5 mg daily and B12 .ml monthly, to start now with first injection given at today's visit. Reviewed side-effects  common to both medications and expected outcomes. Increase daily water intake to at least 8 bottle a day, every day.  Goal is to reduse weight by 5% (8 lbs) by end of three months, and will re-evaluate then.  RTC in 4 weeks for Nurse visit to check weight & BP, and get next B12 injections.    Please refer to After Visit Summary for other counseling recommendations.    Doreene Burke, CNM   Consider the Low Glycemic Index Diet and 6 smaller meals daily .  This boosts your metabolism and regulates your sugars:   Use the protein bar by [employer] because they have lots of fiber in them  Find the low carb flatbreads, tortillas and pita breads for sandwiches:  Joseph's makes a pita bread and a flat bread , available at [facility] and [employer]; Toufayah makes a low carb flatbread available at [employer] and [employer] that is 9 net carbs and 100 cal Mission makes a low carb whole wheat tortilla available at [employer] most grocery stores with 6 net carbs and 210 cal  [place] yogurt can still have a lot of carbs .  Dannon Light N fit has 80 cal and 8 carbs

## 2024-03-21 NOTE — Addendum Note (Signed)
 Addended by: Sheliah Hatch on: 03/21/2024 11:03 AM   Modules accepted: Orders

## 2024-03-21 NOTE — Patient Instructions (Signed)

## 2024-04-23 ENCOUNTER — Ambulatory Visit: Admitting: Certified Nurse Midwife

## 2024-04-26 ENCOUNTER — Ambulatory Visit: Admitting: Certified Nurse Midwife

## 2024-04-26 ENCOUNTER — Encounter: Payer: Self-pay | Admitting: Certified Nurse Midwife

## 2024-04-26 VITALS — BP 109/75 | HR 90 | Wt 155.7 lb

## 2024-04-26 DIAGNOSIS — Z7689 Persons encountering health services in other specified circumstances: Secondary | ICD-10-CM

## 2024-04-26 DIAGNOSIS — Z713 Dietary counseling and surveillance: Secondary | ICD-10-CM

## 2024-04-26 DIAGNOSIS — Z6829 Body mass index (BMI) 29.0-29.9, adult: Secondary | ICD-10-CM | POA: Diagnosis not present

## 2024-04-26 DIAGNOSIS — E669 Obesity, unspecified: Secondary | ICD-10-CM

## 2024-04-26 MED ORDER — CYANOCOBALAMIN 1000 MCG/ML IJ SOLN
1000.0000 ug | Freq: Once | INTRAMUSCULAR | Status: AC
Start: 2024-04-26 — End: 2024-04-26
  Administered 2024-04-26: 1000 ug via INTRAMUSCULAR

## 2024-04-26 MED ORDER — CYANOCOBALAMIN 1000 MCG/ML IJ SOLN: 1000.0000 ug | Freq: Once | INTRAMUSCULAR | 3 refills | Status: AC

## 2024-04-26 MED ORDER — PHENTERMINE HCL 37.5 MG PO TABS: 37.5000 mg | ORAL_TABLET | Freq: Every day | ORAL | 0 refills | Status: DC

## 2024-04-26 NOTE — Patient Instructions (Signed)

## 2024-04-26 NOTE — Progress Notes (Signed)
 SUBJECTIVE:  33 y.o. here for follow-up weight loss visit, previously seen 4 weeks ago. She has lost 6lbs.  Denies any concerns and feels like medication is working well. She denies any side effects. She has been taking 1/2 pill daily. Pt encouraged to continue at this dose until she feels like it is no longer helping her appetite. She is walking more for exercise.   OBJECTIVE:  BP 109/75   Pulse 90   Wt 155 lb 11.2 oz (70.6 kg)   BMI 29.42 kg/m   Body mass index is 29.42 kg/m. Waist: 35 in  Patient appears well. ASSESSMENT:  Obesity- responding well to weight loss plan PLAN:  To continue with current medications. B12 1000mcg/ml injection given RTC in 4 weeks as planned  Alise Appl, CNM

## 2024-05-27 ENCOUNTER — Ambulatory Visit: Admitting: Certified Nurse Midwife

## 2024-06-11 ENCOUNTER — Encounter: Payer: Self-pay | Admitting: Certified Nurse Midwife

## 2024-06-11 ENCOUNTER — Ambulatory Visit: Admitting: Certified Nurse Midwife

## 2024-06-11 VITALS — BP 110/76 | HR 67 | Ht 61.0 in | Wt 153.9 lb

## 2024-06-11 DIAGNOSIS — E669 Obesity, unspecified: Secondary | ICD-10-CM

## 2024-06-11 DIAGNOSIS — Z6829 Body mass index (BMI) 29.0-29.9, adult: Secondary | ICD-10-CM | POA: Diagnosis not present

## 2024-06-11 DIAGNOSIS — Z7689 Persons encountering health services in other specified circumstances: Secondary | ICD-10-CM

## 2024-06-11 MED ORDER — CYANOCOBALAMIN 1000 MCG/ML IJ SOLN
1000.0000 ug | Freq: Once | INTRAMUSCULAR | Status: AC
Start: 2024-06-11 — End: 2024-06-11
  Administered 2024-06-11: 1000 ug via INTRAMUSCULAR

## 2024-06-11 MED ORDER — PHENTERMINE HCL 37.5 MG PO TABS: 37.5000 mg | ORAL_TABLET | Freq: Every day | ORAL | 0 refills | Status: DC

## 2024-06-11 NOTE — Progress Notes (Signed)
 SUBJECTIVE:  34 y.o. here for follow-up weight loss visit, previously seen 6 weeks ago. Denies any concerns and feels like medication is working well, patient denies any side effects to medication, patient states that she tried taking tablet as whole tablet but states that she cut back down to half a tablet daily do to sleep disturbances. She lost 3 lbs this past month.   OBJECTIVE:  BP 110/76   Pulse 67   Ht 5' 1 (1.549 m)   Wt 153 lb 14.4 oz (69.8 kg)   BMI 29.08 kg/m   Body mass index is 29.08 kg/m. Waist: 35 in  Patient appears well.   ASSESSMENT:  Obesity- responding well to weight loss plan   PLAN:  To continue with current medications. B12 1000mcg/ml injection given RTC in 4 weeks as planned  Selena White,  CNM

## 2024-06-11 NOTE — Patient Instructions (Signed)

## 2024-07-09 NOTE — Progress Notes (Unsigned)
 SUBJECTIVE:  33 y.o. here for follow-up weight loss visit, previously seen 4 weeks ago. Denies any concerns and feels like medication is working well. She has continued to take 1/2 tablet daily as it has been working at this dose. She lost 2 lbs this month  OBJECTIVE:  BP 102/72   Pulse 83   Ht 5' 1 (1.549 m)   Wt 151 lb 8 oz (68.7 kg)   LMP 06/26/2024 (Exact Date)   BMI 28.63 kg/m   Body mass index is 28.63 kg/m.  Waist 35 in  Patient appears well. ASSESSMENT:  Obesity- responding well to weight loss plan PLAN:  To continue with current medications. B12 1000mcg/ml injection given RTC in 4 weeks as planned  Zelda Hummer, CNM

## 2024-07-10 ENCOUNTER — Ambulatory Visit (INDEPENDENT_AMBULATORY_CARE_PROVIDER_SITE_OTHER): Admitting: Certified Nurse Midwife

## 2024-07-10 VITALS — BP 102/72 | HR 83 | Ht 61.0 in | Wt 151.5 lb

## 2024-07-10 DIAGNOSIS — Z7689 Persons encountering health services in other specified circumstances: Secondary | ICD-10-CM | POA: Diagnosis not present

## 2024-07-10 DIAGNOSIS — Z6828 Body mass index (BMI) 28.0-28.9, adult: Secondary | ICD-10-CM | POA: Diagnosis not present

## 2024-07-10 DIAGNOSIS — E669 Obesity, unspecified: Secondary | ICD-10-CM | POA: Diagnosis not present

## 2024-07-10 MED ORDER — CYANOCOBALAMIN 1000 MCG/ML IJ SOLN
1000.0000 ug | Freq: Once | INTRAMUSCULAR | Status: AC
Start: 2024-07-10 — End: 2024-07-10
  Administered 2024-07-10: 1000 ug via INTRAMUSCULAR

## 2024-07-10 MED ORDER — PHENTERMINE HCL 37.5 MG PO TABS: 37.5000 mg | ORAL_TABLET | Freq: Every day | ORAL | 0 refills | Status: AC

## 2024-07-10 NOTE — Patient Instructions (Signed)

## 2024-08-16 ENCOUNTER — Ambulatory Visit: Admitting: Certified Nurse Midwife

## 2024-08-16 DIAGNOSIS — Z7689 Persons encountering health services in other specified circumstances: Secondary | ICD-10-CM

## 2024-08-16 DIAGNOSIS — E669 Obesity, unspecified: Secondary | ICD-10-CM
